# Patient Record
Sex: Female | Born: 2000 | Race: Black or African American | Hispanic: No | Marital: Single | Smoking: Never smoker
Health system: Southern US, Community
[De-identification: ages and names within clinical notes are randomized; demographics above are authoritative.]

## PROBLEM LIST (undated history)

## (undated) DIAGNOSIS — J45909 Unspecified asthma, uncomplicated: Secondary | ICD-10-CM

## (undated) DIAGNOSIS — K5900 Constipation, unspecified: Secondary | ICD-10-CM

## (undated) HISTORY — PX: NO PAST SURGERIES: SHX2092

---
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
## 2019-01-06 DIAGNOSIS — R319 Hematuria, unspecified: Secondary | ICD-10-CM | POA: Diagnosis not present

## 2019-01-06 DIAGNOSIS — N390 Urinary tract infection, site not specified: Secondary | ICD-10-CM | POA: Diagnosis not present

## 2019-01-08 DIAGNOSIS — N939 Abnormal uterine and vaginal bleeding, unspecified: Secondary | ICD-10-CM | POA: Diagnosis not present

## 2019-01-08 DIAGNOSIS — N898 Other specified noninflammatory disorders of vagina: Secondary | ICD-10-CM | POA: Diagnosis not present

## 2019-01-08 DIAGNOSIS — R1084 Generalized abdominal pain: Secondary | ICD-10-CM | POA: Diagnosis not present

## 2019-01-08 DIAGNOSIS — R112 Nausea with vomiting, unspecified: Secondary | ICD-10-CM | POA: Diagnosis not present

## 2019-01-08 DIAGNOSIS — K920 Hematemesis: Secondary | ICD-10-CM | POA: Diagnosis not present

## 2019-01-08 DIAGNOSIS — G8929 Other chronic pain: Secondary | ICD-10-CM | POA: Diagnosis not present

## 2019-01-27 DIAGNOSIS — R112 Nausea with vomiting, unspecified: Secondary | ICD-10-CM | POA: Diagnosis not present

## 2019-01-27 DIAGNOSIS — T43221A Poisoning by selective serotonin reuptake inhibitors, accidental (unintentional), initial encounter: Secondary | ICD-10-CM | POA: Diagnosis not present

## 2019-01-27 DIAGNOSIS — Z793 Long term (current) use of hormonal contraceptives: Secondary | ICD-10-CM | POA: Diagnosis not present

## 2019-01-27 DIAGNOSIS — T50901A Poisoning by unspecified drugs, medicaments and biological substances, accidental (unintentional), initial encounter: Secondary | ICD-10-CM | POA: Diagnosis not present

## 2019-01-27 DIAGNOSIS — Z79899 Other long term (current) drug therapy: Secondary | ICD-10-CM | POA: Diagnosis not present

## 2019-01-27 DIAGNOSIS — Z791 Long term (current) use of non-steroidal anti-inflammatories (NSAID): Secondary | ICD-10-CM | POA: Diagnosis not present

## 2019-01-27 DIAGNOSIS — F329 Major depressive disorder, single episode, unspecified: Secondary | ICD-10-CM | POA: Diagnosis not present

## 2019-01-27 DIAGNOSIS — N390 Urinary tract infection, site not specified: Secondary | ICD-10-CM | POA: Diagnosis not present

## 2019-01-28 DIAGNOSIS — N939 Abnormal uterine and vaginal bleeding, unspecified: Secondary | ICD-10-CM | POA: Diagnosis not present

## 2019-01-28 DIAGNOSIS — R202 Paresthesia of skin: Secondary | ICD-10-CM | POA: Diagnosis not present

## 2019-02-09 DIAGNOSIS — M25511 Pain in right shoulder: Secondary | ICD-10-CM | POA: Diagnosis not present

## 2019-02-09 DIAGNOSIS — R200 Anesthesia of skin: Secondary | ICD-10-CM | POA: Diagnosis not present

## 2019-02-10 DIAGNOSIS — Z978 Presence of other specified devices: Secondary | ICD-10-CM | POA: Diagnosis not present

## 2019-03-09 DIAGNOSIS — R1084 Generalized abdominal pain: Secondary | ICD-10-CM | POA: Diagnosis not present

## 2019-03-09 DIAGNOSIS — R1030 Lower abdominal pain, unspecified: Secondary | ICD-10-CM | POA: Diagnosis not present

## 2019-03-09 DIAGNOSIS — N920 Excessive and frequent menstruation with regular cycle: Secondary | ICD-10-CM | POA: Diagnosis not present

## 2019-03-22 DIAGNOSIS — N760 Acute vaginitis: Secondary | ICD-10-CM | POA: Diagnosis not present

## 2019-03-22 DIAGNOSIS — N898 Other specified noninflammatory disorders of vagina: Secondary | ICD-10-CM | POA: Diagnosis not present

## 2019-03-22 DIAGNOSIS — Z113 Encounter for screening for infections with a predominantly sexual mode of transmission: Secondary | ICD-10-CM | POA: Diagnosis not present

## 2019-03-22 DIAGNOSIS — N83202 Unspecified ovarian cyst, left side: Secondary | ICD-10-CM | POA: Diagnosis not present

## 2019-03-22 DIAGNOSIS — R102 Pelvic and perineal pain: Secondary | ICD-10-CM | POA: Diagnosis not present

## 2019-03-22 DIAGNOSIS — B9689 Other specified bacterial agents as the cause of diseases classified elsewhere: Secondary | ICD-10-CM | POA: Diagnosis not present

## 2019-03-22 DIAGNOSIS — Z01812 Encounter for preprocedural laboratory examination: Secondary | ICD-10-CM | POA: Diagnosis not present

## 2019-04-01 DIAGNOSIS — Z79899 Other long term (current) drug therapy: Secondary | ICD-10-CM | POA: Diagnosis not present

## 2019-04-01 DIAGNOSIS — N938 Other specified abnormal uterine and vaginal bleeding: Secondary | ICD-10-CM | POA: Diagnosis not present

## 2019-04-01 DIAGNOSIS — N920 Excessive and frequent menstruation with regular cycle: Secondary | ICD-10-CM | POA: Diagnosis not present

## 2019-04-05 IMAGING — CR DG CHEST 2V
2 series · 2 of 2 positions shown · non-contrast
Comparison: None.

CLINICAL DATA: Pain in the right scapula fever

EXAM:
CHEST - 2 VIEW

[w chest pa]
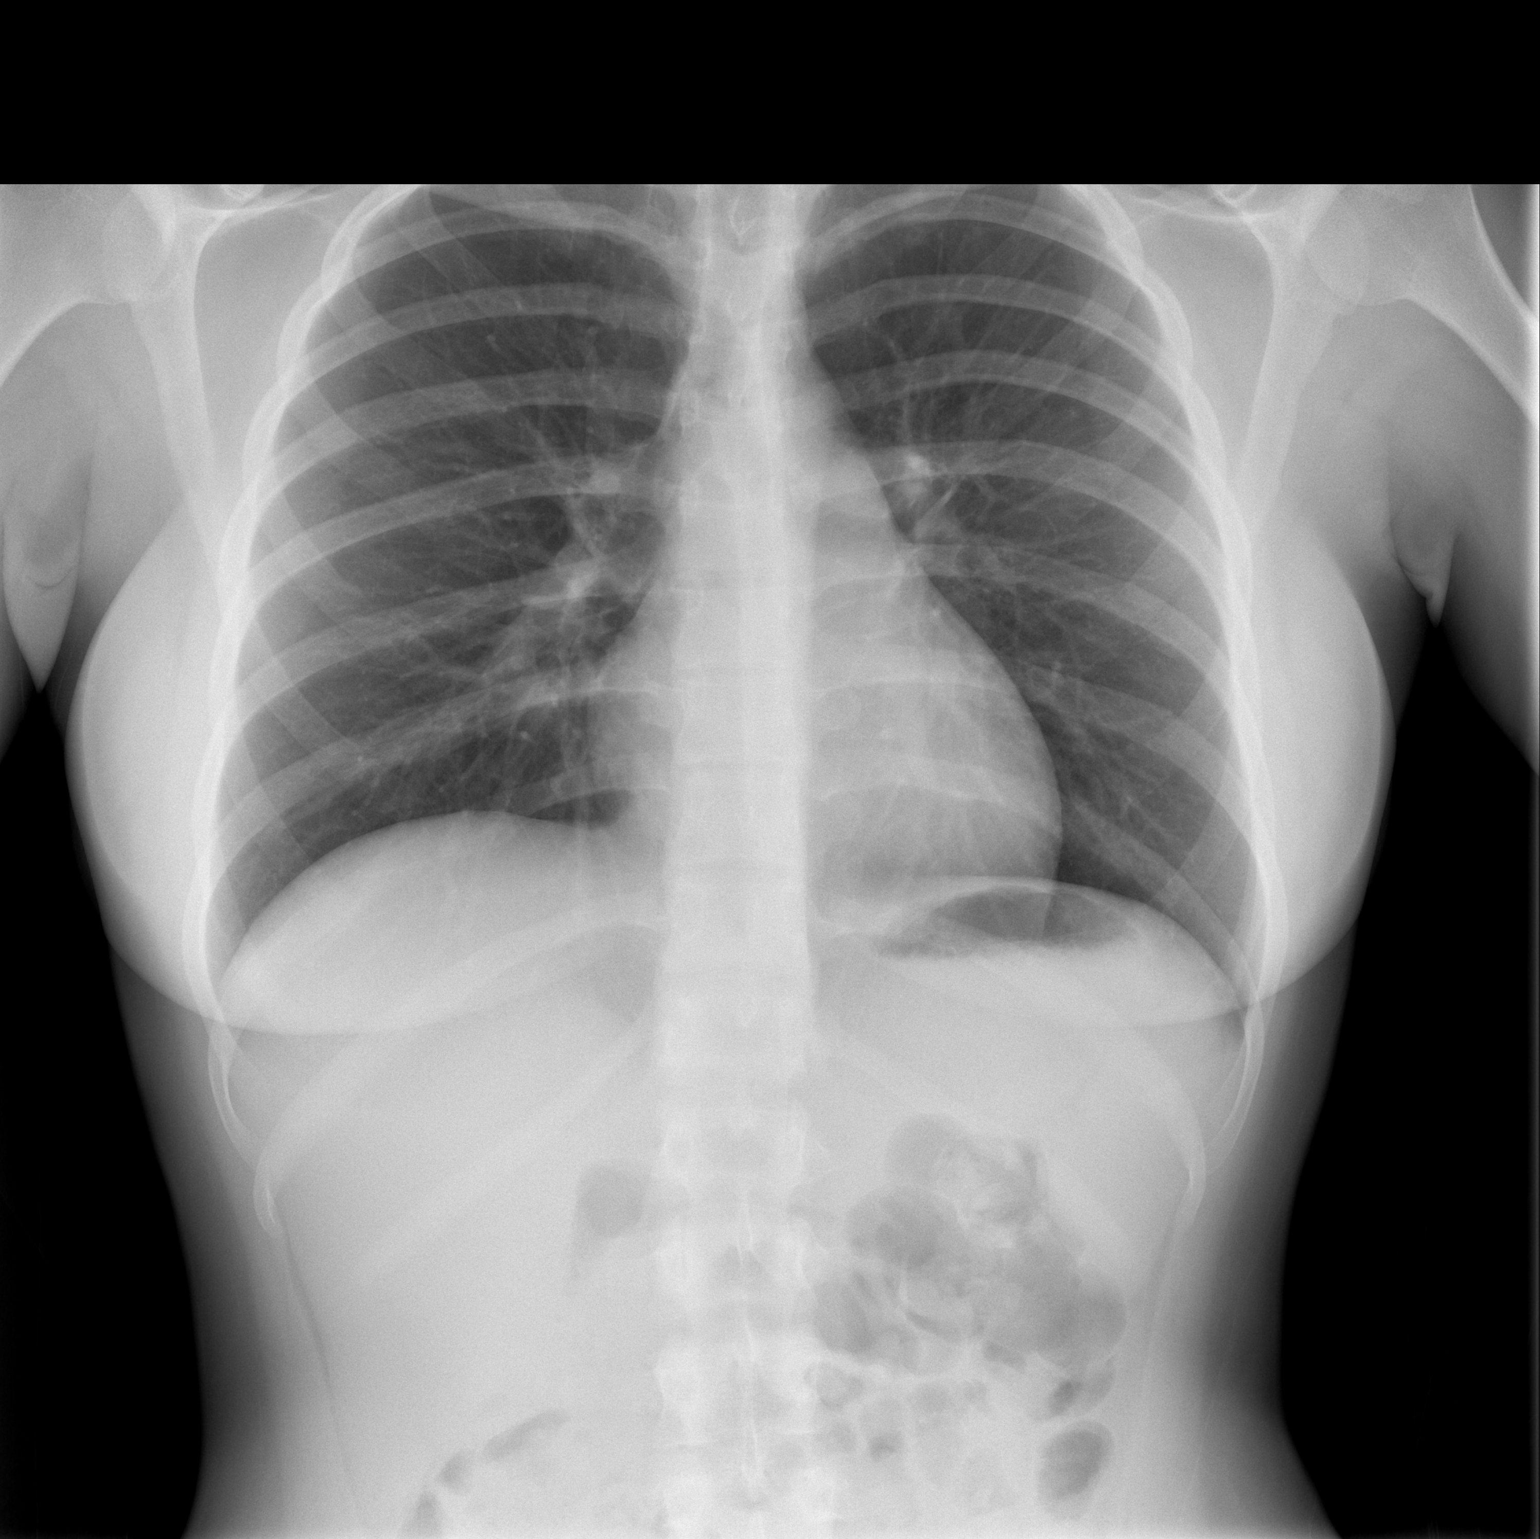

[w chest lat]
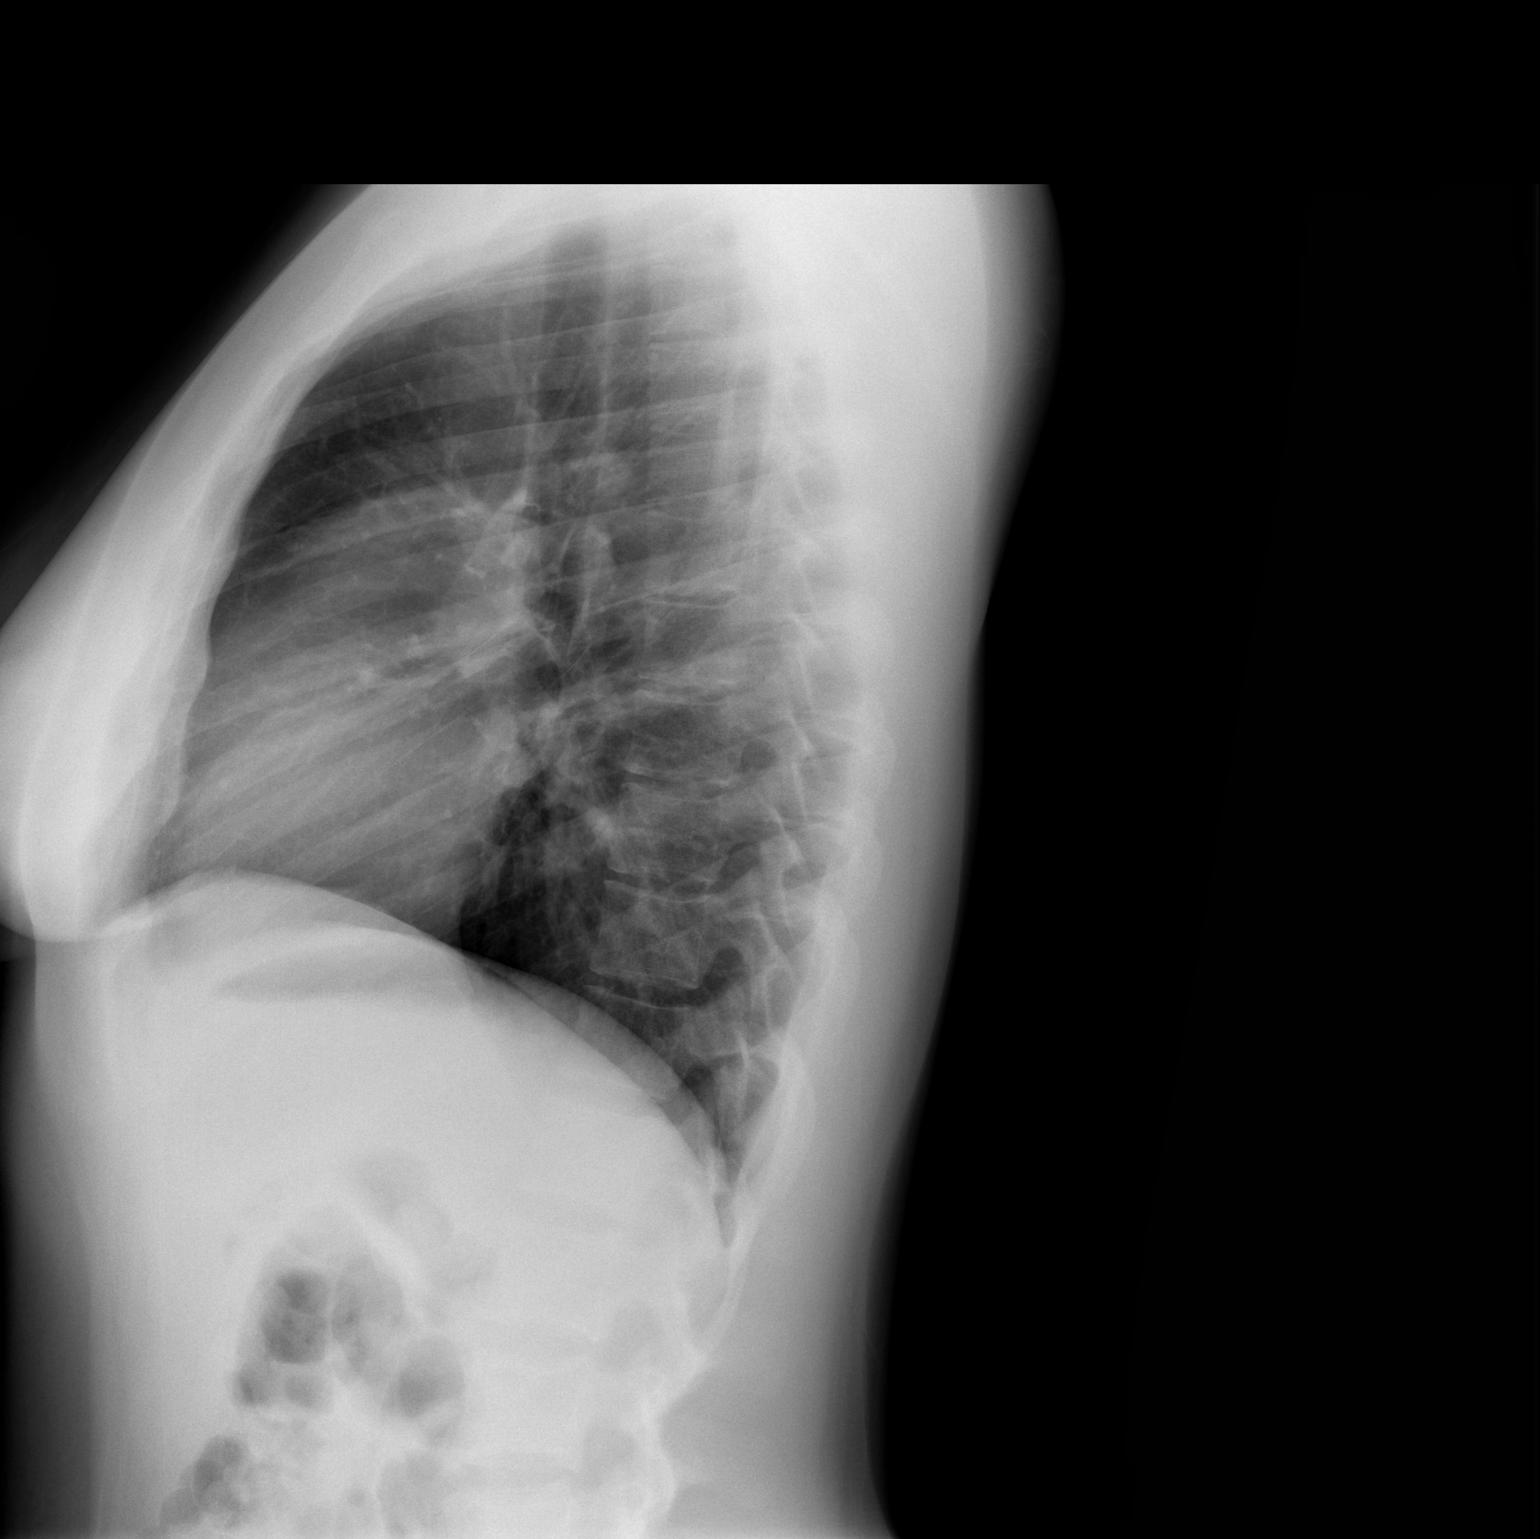

[2 of 2 positions shown; findings below may reference images not displayed]

FINDINGS: The heart size and mediastinal contours are within normal limits.
Both lungs are clear. The visualized skeletal structures are
unremarkable.
IMPRESSION: No active cardiopulmonary disease.

## 2019-04-23 DIAGNOSIS — R05 Cough: Secondary | ICD-10-CM | POA: Diagnosis not present

## 2019-04-23 DIAGNOSIS — Z793 Long term (current) use of hormonal contraceptives: Secondary | ICD-10-CM | POA: Diagnosis not present

## 2019-04-23 DIAGNOSIS — R0602 Shortness of breath: Secondary | ICD-10-CM | POA: Diagnosis not present

## 2019-04-23 DIAGNOSIS — Z791 Long term (current) use of non-steroidal anti-inflammatories (NSAID): Secondary | ICD-10-CM | POA: Diagnosis not present

## 2019-04-23 DIAGNOSIS — F329 Major depressive disorder, single episode, unspecified: Secondary | ICD-10-CM | POA: Diagnosis not present

## 2019-04-23 DIAGNOSIS — R5383 Other fatigue: Secondary | ICD-10-CM | POA: Diagnosis not present

## 2019-04-23 DIAGNOSIS — Z20828 Contact with and (suspected) exposure to other viral communicable diseases: Secondary | ICD-10-CM | POA: Diagnosis not present

## 2019-04-23 DIAGNOSIS — Z79899 Other long term (current) drug therapy: Secondary | ICD-10-CM | POA: Diagnosis not present

## 2019-04-23 DIAGNOSIS — J069 Acute upper respiratory infection, unspecified: Secondary | ICD-10-CM | POA: Diagnosis not present

## 2019-06-14 DIAGNOSIS — S161XXA Strain of muscle, fascia and tendon at neck level, initial encounter: Secondary | ICD-10-CM | POA: Diagnosis not present

## 2019-06-14 DIAGNOSIS — N3090 Cystitis, unspecified without hematuria: Secondary | ICD-10-CM | POA: Diagnosis not present

## 2019-06-14 DIAGNOSIS — S199XXA Unspecified injury of neck, initial encounter: Secondary | ICD-10-CM | POA: Diagnosis not present

## 2019-06-14 DIAGNOSIS — M62838 Other muscle spasm: Secondary | ICD-10-CM | POA: Diagnosis not present

## 2019-06-14 DIAGNOSIS — X500XXA Overexertion from strenuous movement or load, initial encounter: Secondary | ICD-10-CM | POA: Diagnosis not present

## 2019-07-13 DIAGNOSIS — R110 Nausea: Secondary | ICD-10-CM | POA: Diagnosis not present

## 2019-07-13 DIAGNOSIS — M25561 Pain in right knee: Secondary | ICD-10-CM | POA: Diagnosis not present

## 2019-07-13 DIAGNOSIS — R109 Unspecified abdominal pain: Secondary | ICD-10-CM | POA: Diagnosis not present

## 2019-07-13 DIAGNOSIS — R1012 Left upper quadrant pain: Secondary | ICD-10-CM | POA: Diagnosis not present

## 2019-07-13 DIAGNOSIS — R935 Abnormal findings on diagnostic imaging of other abdominal regions, including retroperitoneum: Secondary | ICD-10-CM | POA: Diagnosis not present

## 2019-07-28 IMAGING — US US ABDOMEN LIMITED
1 series · 14 of 25 positions shown · non-contrast
Comparison: None.

CLINICAL DATA: Right scapula pain for several weeks

EXAM:
ULTRASOUND ABDOMEN LIMITED RIGHT UPPER QUADRANT

[Series 1: us abdomen limited · 0.12mm/px · 14 of 40 slices shown]
[im 1/40]
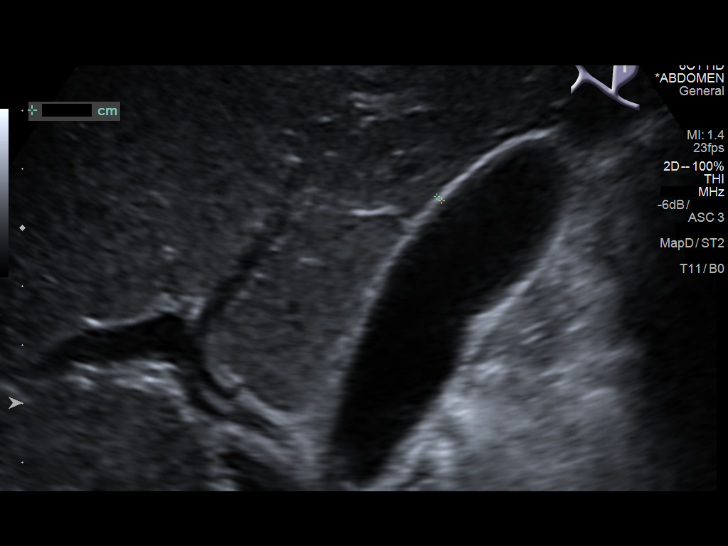
[im 4/40]
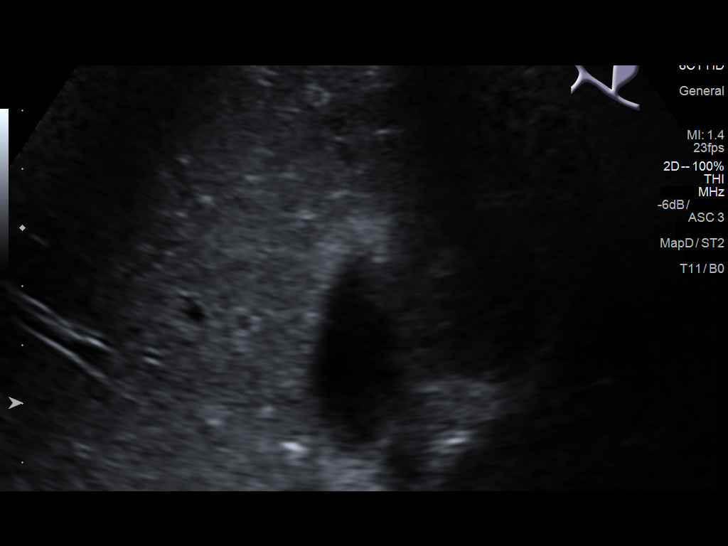
[im 7/40]
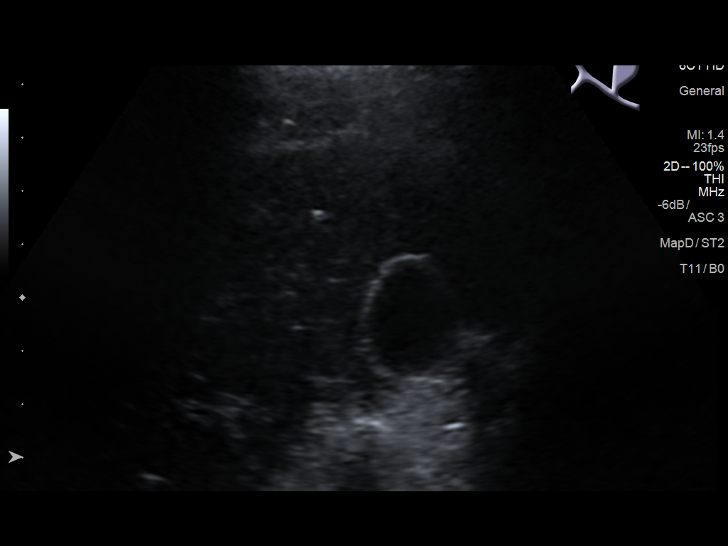
[im 10/40]
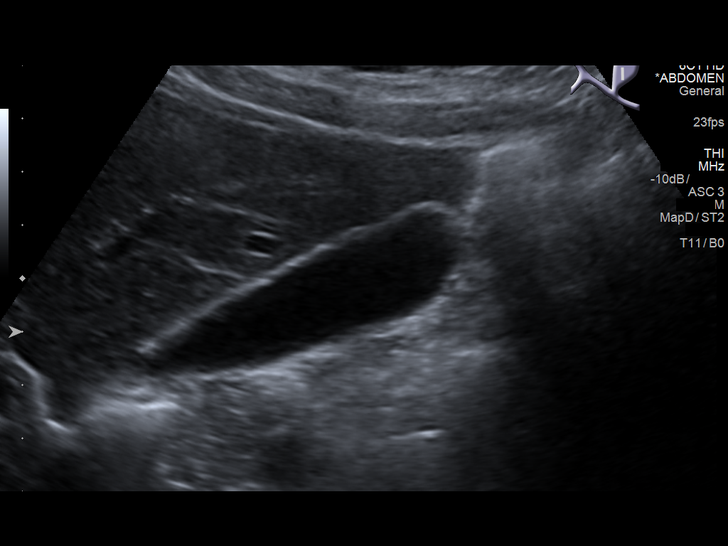
[im 14/40]
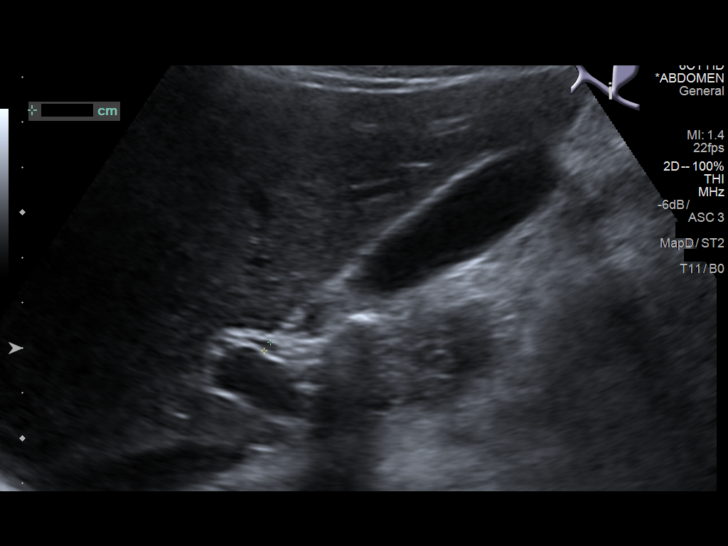
[im 15/40]
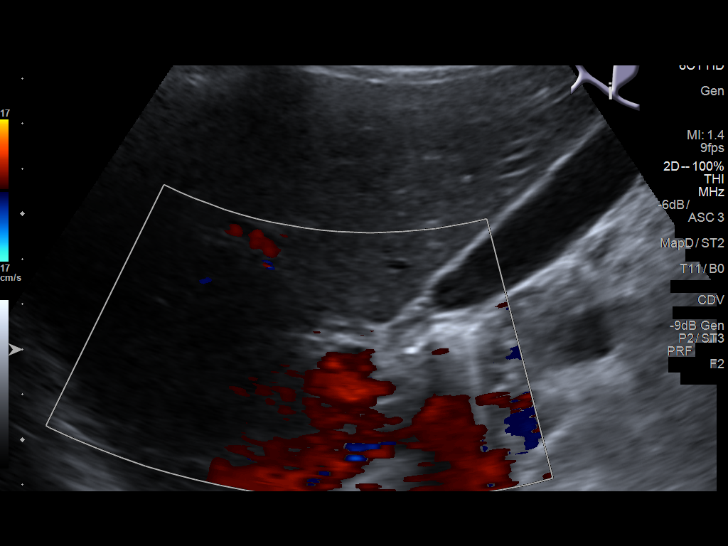
[im 18/40]
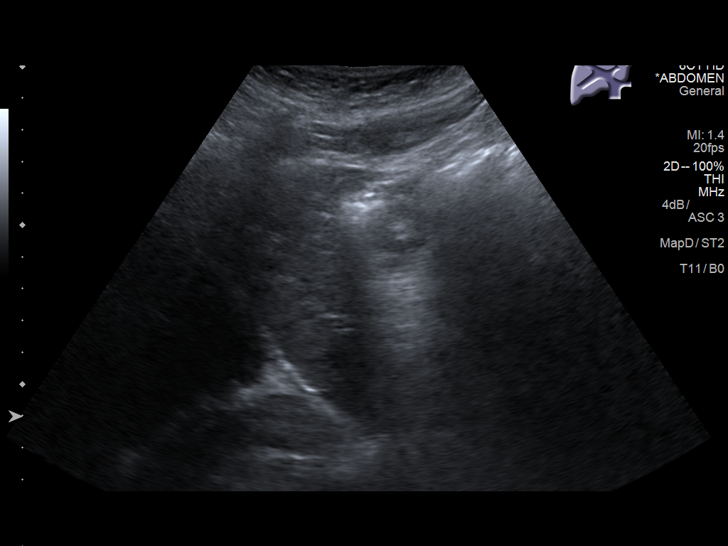
[im 22/40]
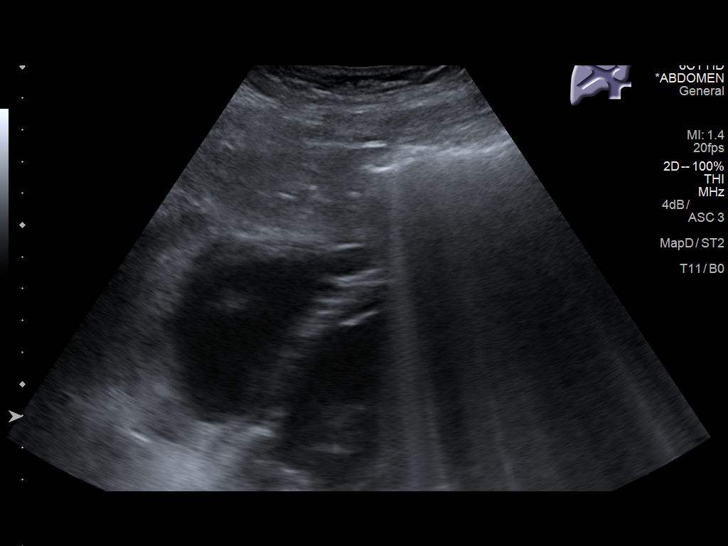
[im 25/40]
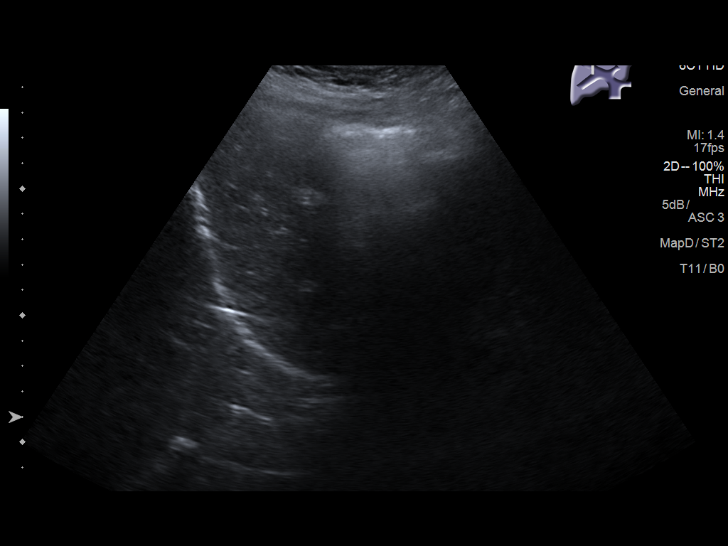
[im 27/40]
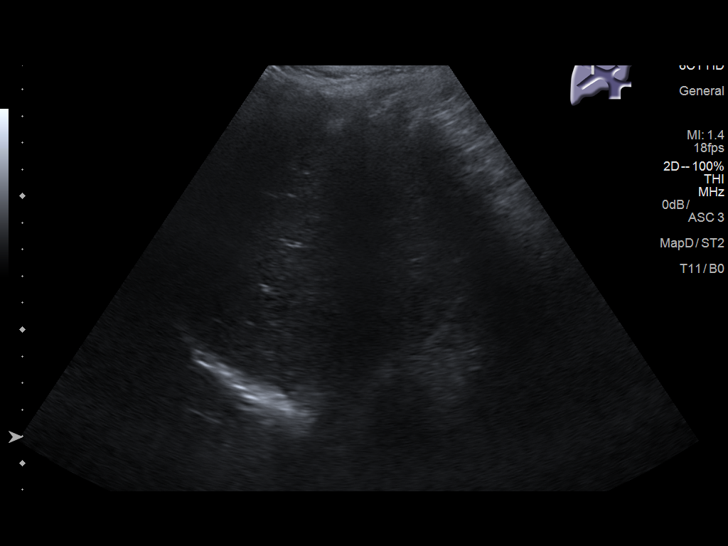
[im 30/40]
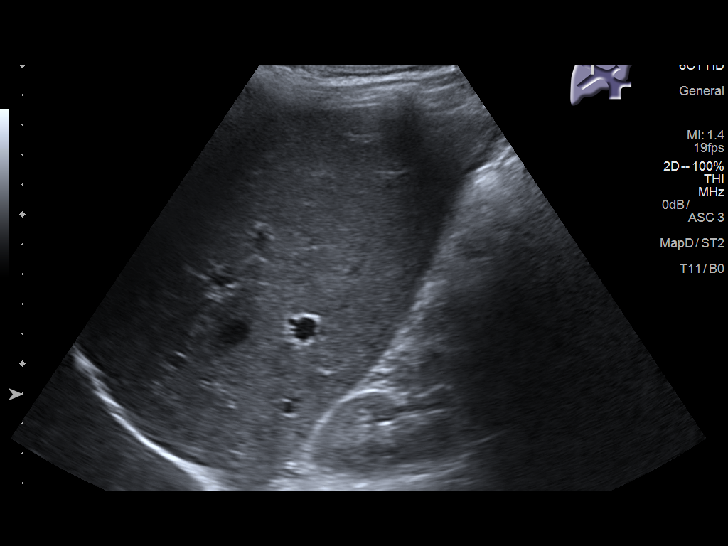
[im 33/40]
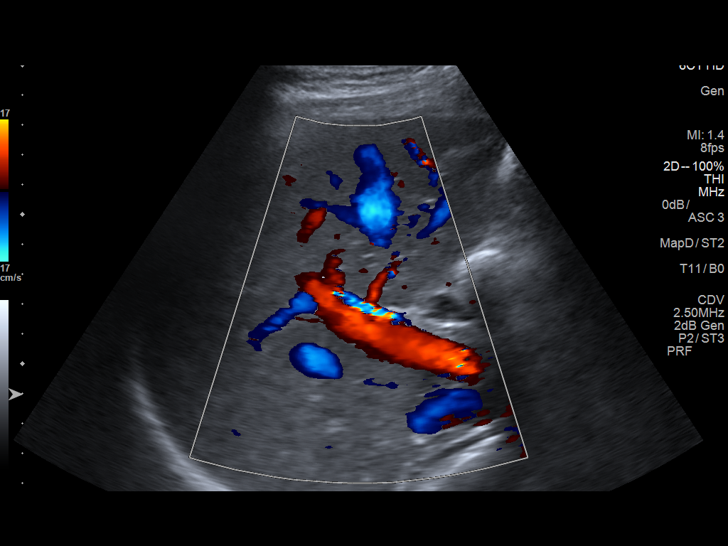
[im 36/40]
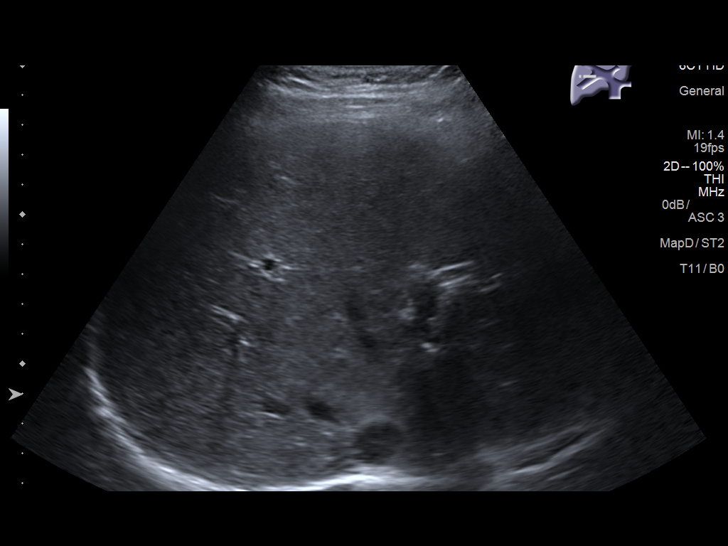
[im 40/40]
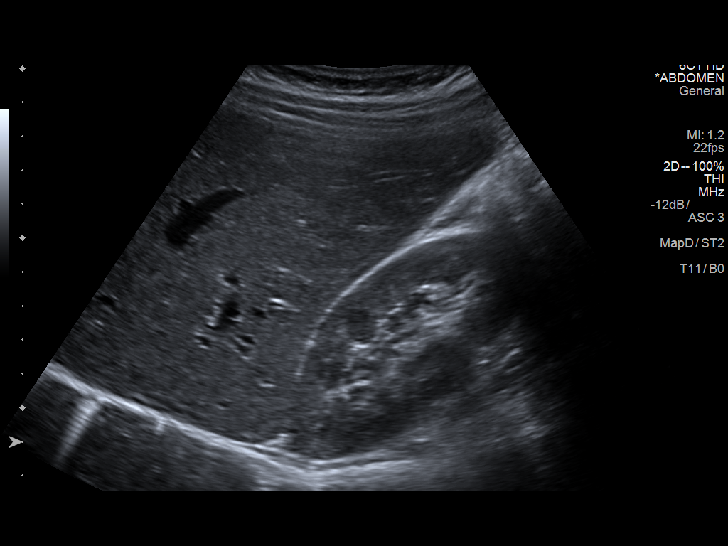

[14 of 25 positions shown; findings below may reference images not displayed]

FINDINGS: Gallbladder:

No gallstones or wall thickening visualized. No sonographic Murphy
sign noted by sonographer.

Common bile duct:

Diameter: 2.4 mm

Liver:

No focal lesion identified. Within normal limits in parenchymal
echogenicity. Portal vein is patent on color Doppler imaging with
normal direction of blood flow towards the liver.
IMPRESSION: Negative right upper quadrant abdominal ultrasound

## 2019-08-25 DIAGNOSIS — M25561 Pain in right knee: Secondary | ICD-10-CM | POA: Diagnosis not present

## 2019-09-22 ENCOUNTER — Encounter (HOSPITAL_COMMUNITY): Payer: Self-pay

## 2019-09-22 ENCOUNTER — Emergency Department (HOSPITAL_COMMUNITY): Payer: Medicaid Other

## 2019-09-22 ENCOUNTER — Other Ambulatory Visit: Payer: Self-pay

## 2019-09-22 ENCOUNTER — Emergency Department (HOSPITAL_COMMUNITY)
Admission: EM | Admit: 2019-09-22 | Discharge: 2019-09-22 | Disposition: A | Discharge status: Home / Self Care | Payer: Medicaid Other | Attending: Emergency Medicine | Admitting: Emergency Medicine

## 2019-09-22 DIAGNOSIS — W19XXXD Unspecified fall, subsequent encounter: Secondary | ICD-10-CM | POA: Insufficient documentation

## 2019-09-22 DIAGNOSIS — G8929 Other chronic pain: Secondary | ICD-10-CM | POA: Insufficient documentation

## 2019-09-22 DIAGNOSIS — M25511 Pain in right shoulder: Secondary | ICD-10-CM | POA: Insufficient documentation

## 2019-09-22 DIAGNOSIS — Z79899 Other long term (current) drug therapy: Secondary | ICD-10-CM | POA: Diagnosis not present

## 2019-09-22 MED ORDER — KETOROLAC TROMETHAMINE 15 MG/ML IJ SOLN
15.0000 mg | Freq: Once | INTRAMUSCULAR | Status: AC
  Administered 2019-09-22: 15 mg via INTRAMUSCULAR
  Filled 2019-09-22: qty 1

## 2019-09-22 MED ORDER — LIDOCAINE 5 % EX PTCH
1.0000 | MEDICATED_PATCH | CUTANEOUS | Status: DC
  Administered 2019-09-22: 17:00:00 1 via TRANSDERMAL
  Filled 2019-09-22: qty 1

## 2019-09-22 NOTE — ED Provider Notes (Signed)
[facility] DEPT Provider Note   CSN: [id number] Arrival date & time: 09/22/19  1524     History Chief Complaint  Patient presents with  . Neck Pain  . Shoulder Pain    Sandra Roach is a 19 y.o. female otherwise healthy no daily medication use presents today for right shoulder pain.  She reports that in August 2020 she fell onto her right shoulder, at that time she had mild pain which never completely resolved.  She reports lingering pain for the past 6 months.  She reports that 2-3 nights ago she was sleeping on her right shoulder, when she woke up she felt as if her pain had increased.  She denies any new injury to the area.  She describes a moderate in intensity ache constant worsened with movement improved with Tylenol occasionally radiating to the right side of her neck.  She denies any fever/chills, new injuries, headache, numbness/tingling, weakness, chest pain/shortness of breath, swelling/color change, and additional concerns. HPI     History reviewed. No pertinent past medical history.  There are no problems to display for this patient.   History reviewed. No pertinent surgical history.   OB History   No obstetric history on file.     Family History  Problem Relation Age of Onset  . Cancer Mother     Social History   Tobacco Use  . Smoking status: Never Smoker  . Smokeless tobacco: Never Used  Substance Use Topics  . Alcohol use: Never  . Drug use: Yes    Types: Marijuana    Home Medications Prior to Admission medications   Medication Sig Start Date End Date Taking? Authorizing Provider  cephALEXin (KEFLEX) 500 MG capsule Take 1 capsule (500 mg total) by mouth 3 (three) times daily. 02/10/18   Horton, Barbette Hair, MD  polyethylene glycol [facility]) packet Take 17 g by mouth daily. 02/10/18   Horton, Barbette Hair, MD    Allergies    Patient has no known allergies.  Review of Systems   Review of Systems  Constitutional:  Negative for chills and fever.  Respiratory: Negative for shortness of breath.   Cardiovascular: Negative for chest pain.  Gastrointestinal: Negative for nausea and vomiting.  Musculoskeletal: Positive for arthralgias. Negative for back pain, joint swelling and neck stiffness.  Neurological: Negative for weakness, numbness and headaches.    Physical Exam Updated Vital Signs BP 132/73 (BP Location: Right Arm)   Pulse 93   Temp 98.8 F (37.1 C) (Oral)   Resp 15   Ht 5\' 5"  (1.651 m)   Wt 79.4 kg   LMP 08/26/2019 (Approximate)   SpO2 100%   BMI 29.12 kg/m   Physical Exam Constitutional:      General: She is not in acute distress.    Appearance: Normal appearance. She is well-developed. She is not ill-appearing or diaphoretic.  HENT:     Head: Normocephalic and atraumatic.     Jaw: There is normal jaw occlusion.     Right Ear: External ear normal. No hemotympanum.     Left Ear: External ear normal. No hemotympanum.     Nose: Nose normal.     Mouth/Throat:     Mouth: Mucous membranes are moist.     Pharynx: Oropharynx is clear. Uvula midline.  Eyes:     General: Vision grossly intact. Gaze aligned appropriately.     Extraocular Movements: Extraocular movements intact.     Conjunctiva/sclera: Conjunctivae normal.     Pupils: Pupils  are equal, round, and reactive to light.  Neck:     Trachea: Trachea and phonation normal. No tracheal tenderness or tracheal deviation.      Comments: No midline C/T/L spinal tenderness to palpation, no deformity, crepitus, or step-off noted. No sign of injury to the neck or back. Cardiovascular:     Rate and Rhythm: Normal rate and regular rhythm.     Pulses:          Radial pulses are 2+ on the right side and 2+ on the left side.  Pulmonary:     Effort: Pulmonary effort is normal. No respiratory distress.     Breath sounds: Normal air entry.  Chest:     Chest wall: No tenderness.  Abdominal:     General: There is no distension.      Palpations: Abdomen is soft.     Tenderness: There is no abdominal tenderness. There is no guarding or rebound.  Musculoskeletal:        General: Normal range of motion.     Cervical back: Normal range of motion and neck supple. Muscular tenderness present. No spinous process tenderness.     Comments: Right Shoulder: Appearance normal. No obvious bony deformity. No skin swelling, erythema, heat, fluctuance or break of the skin. No clavicular deformity or TTP. TTP over right deltoid and right trapezius muscles. Active and passive flexion, extension, abduction, adduction, and internal/external rotation intact without crepitus, there is increased pain with range of motion.  Patient refuses range of motion of the arm above shoulder level.  - Right Elbow: Appearance normal. No obvious bony deformity. No skin swelling, erythema, heat, fluctuance or break of the skin. No TTP over joint. Active flexion, extension, supination and pronation full and intact without pain. Strength able and appropriate for age for flexion and extension.  Radial Pulse 2+. Cap refill <2 seconds. SILT for M/U/R distributions. Compartments soft.   Skin:    General: Skin is warm and dry.  Neurological:     Mental Status: She is alert.     GCS: GCS eye subscore is 4. GCS verbal subscore is 5. GCS motor subscore is 6.     Comments: Speech is clear and goal oriented, follows commands Major Cranial nerves without deficit, no facial droop Moves extremities without ataxia, coordination intact  Psychiatric:        Behavior: Behavior normal.     ED Results / Procedures / Treatments   Labs (all labs ordered are listed, but only abnormal results are displayed) Labs Reviewed - No data to display  EKG None  Radiology DG Shoulder Right  Result Date: 09/22/2019 CLINICAL DATA:  Fall several months ago with persistent right shoulder pain, initial encounter EXAM: RIGHT SHOULDER - 2+ VIEW COMPARISON:  None. FINDINGS: There is no  evidence of fracture or dislocation. There is no evidence of arthropathy or other focal bone abnormality. Soft tissues are unremarkable. IMPRESSION: No acute abnormality noted. Electronically Signed   By: Alcide Clever M.D.   On: 09/22/2019 16:25    Procedures Procedures (including critical care time)  Medications Ordered in ED Medications  ketorolac (TORADOL) 15 MG/ML injection 15 mg (has no administration in time range)  lidocaine (LIDODERM) 5 % 1 patch (has no administration in time range)    ED Course  I have reviewed the triage vital signs and the nursing notes.  Pertinent labs & imaging results that were available during my care of the patient were reviewed by me and considered in my  medical decision making (see chart for details).    MDM Rules/Calculators/A&P                     19 year old female presents today with a 55-month history of right shoulder pain after a fall.  Intermittent lingering pain since that time worsened over the past 2-3 days after she reports she slept on her right side.  She is neurovascular intact to the right upper extremity with strong equal distal pulses, appropriate capillary refill and intact sensation.  Strength and range of motion of the right hand wrist elbow is intact.  She has decreased strength and range of motion secondary to pain of the right shoulder and refuses to move above shoulder level.  There is no evidence of injury of the neck, no midline tenderness, step-off, crepitus or deformity she has full range of motion at the neck without pain and has no neurologic complaints.  Additionally no symptoms suggestive of cauda equina.  No evidence of a cellulitis, DVT, septic arthritis, compartment syndrome or gross ligamentous laxity.  Will obtain x-ray of the right shoulder and reassess.  Suspect musculoskeletal etiology pain today.  Of note triage note mentions right-sided neck pain.  Patient has no pain of the neck on exam, she has pain rating from the  right shoulder upper trapezius which is because of the pain.  Per Nexus criteria there is no indication for imaging of the C-spine at this time.  DG Right Shoulder:    IMPRESSION:  No acute abnormality noted.  I have personally reviewed patient's x-ray and agree with radiologist interpretation.  No obvious fractures. - Concerned today that patient may have rotator cuff injury, she will be placed in an arm sling and referred to orthopedist.  On reexamination she is well-appearing and in no acute distress.  Remains neurovascularly intact to the right upper extremity.  She is agreeable to plan of care above.  Patient was offered Toradol shot today, she accepted, she denies any history of CKD, gastric ulcer adverse reaction to NSAID medications.  Of note patient denies chance of pregnancy today, she reports that she had a negative home pregnancy test 2 days ago.  Patient states understanding that medications given or prescribed today may result in harm to of a pregnancy and she accepts these risks and still chooses not to be pregnancy tested and proceed with medications.  At this time there does not appear to be any evidence of an acute emergency medical condition and the patient appears stable for discharge with appropriate outpatient follow up. Diagnosis was discussed with patient who verbalizes understanding of care plan and is agreeable to discharge. I have discussed return precautions with patient who verbalizes understanding of return precautions. Patient encouraged to follow-up with their PCP and Ortho. All questions answered.   Note: Portions of this report may have been transcribed using voice recognition software. Every effort was made to ensure accuracy; however, inadvertent computerized transcription errors may still be present. Final Clinical Impression(s) / ED Diagnoses Final diagnoses:  Chronic right shoulder pain    Rx / DC Orders ED Discharge Orders    None       Elizabeth Palau 09/22/19 1700    Sabas Sous, MD 09/23/19 1557

## 2019-09-22 NOTE — Discharge Instructions (Addendum)
You have been diagnosed today with Right Shoulder Pain.  At this time there does not appear to be the presence of an emergent medical condition, however there is always the potential for conditions to change. Please read and follow the below instructions.  Please return to the Emergency Department immediately for any new or worsening symptoms. Please be sure to follow up with your Primary Care Provider within one week regarding your visit today; please call their office to schedule an appointment even if you are feeling better for a follow-up visit. You have been given an NSAID-containing medication called Toradol today.  Do not take the medications including ibuprofen, Aleve, Advil, naproxen or other NSAID-containing medications for the next 2 days.  Please be sure to drink plenty of water over the next few days.  You may use the sling given to you today to help protect your shoulder.  As your shoulder pain begins to improve you may use gentle range of motion exercises to avoid stiffness as long as it does not cause pain. You may call the orthopedic specialist Dr. Magnus Ivan on your discharge paperwork to schedule follow-up appointment for your shoulder pain.  As we discussed your x-ray today did not show any acute findings however unseen injuries of soft tissues as well as unseen fractures may still be present so orthopedic follow-up is recommended.  Get help right away if: Your arm, hand, or fingers: Tingle. Are numb. Are swollen. Are painful. Turn white or blue. You pee or poop on yourself (have incontinence) You have numbness or your genitals You have fever or chills You have any new/concerning or worsening of symptoms  Please read the additional information packets attached to your discharge summary.  Do not take your medicine if  develop an itchy rash, swelling in your mouth or lips, or difficulty breathing; call 911 and seek immediate emergency medical attention if this occurs.  Note:  Portions of this text may have been transcribed using voice recognition software. Every effort was made to ensure accuracy; however, inadvertent computerized transcription errors may still be present.

## 2019-09-22 NOTE — ED Notes (Signed)
An After Visit Summary was printed and given to the patient. Discharge instructions given and no further questions at this time. Pt denied sling immobilizer. Pt moving both arms with complaint of pain.

## 2019-09-22 NOTE — ED Triage Notes (Signed)
Patient c/o right neck pain that radiates into the right shoulder x 2 weeks.

## 2019-09-27 ENCOUNTER — Ambulatory Visit: Payer: Medicaid Other | Admitting: Orthopaedic Surgery

## 2019-10-13 DIAGNOSIS — Z20828 Contact with and (suspected) exposure to other viral communicable diseases: Secondary | ICD-10-CM | POA: Diagnosis not present

## 2019-11-23 DIAGNOSIS — R110 Nausea: Secondary | ICD-10-CM | POA: Diagnosis not present

## 2019-11-23 DIAGNOSIS — R197 Diarrhea, unspecified: Secondary | ICD-10-CM | POA: Diagnosis not present

## 2019-11-23 DIAGNOSIS — R109 Unspecified abdominal pain: Secondary | ICD-10-CM | POA: Diagnosis not present

## 2019-11-23 DIAGNOSIS — R1084 Generalized abdominal pain: Secondary | ICD-10-CM | POA: Diagnosis not present

## 2019-11-24 ENCOUNTER — Ambulatory Visit
Admission: EM | Admit: 2019-11-24 | Discharge: 2019-11-24 | Disposition: A | Discharge status: Home / Self Care | Payer: Medicaid Other

## 2019-11-24 ENCOUNTER — Other Ambulatory Visit: Payer: Self-pay

## 2019-11-24 ENCOUNTER — Encounter: Payer: Self-pay | Admitting: Emergency Medicine

## 2019-11-24 DIAGNOSIS — R1084 Generalized abdominal pain: Secondary | ICD-10-CM

## 2019-11-24 DIAGNOSIS — K5900 Constipation, unspecified: Secondary | ICD-10-CM | POA: Diagnosis not present

## 2019-11-24 DIAGNOSIS — K591 Functional diarrhea: Secondary | ICD-10-CM

## 2019-11-24 HISTORY — DX: Constipation, unspecified: K59.00

## 2019-11-24 MED ORDER — POLYETHYLENE GLYCOL 3350 17 GM/SCOOP PO POWD: 1.0000 | Freq: Once | ORAL | 0 refills | Status: AC

## 2019-11-24 NOTE — ED Triage Notes (Signed)
Patient has had rectal pain for a month.  Patient has had hard stools recently.  Noticed blood on tissue and in toilet patient complains of lower back and lower abdominal pain-a cramping feeling.  Denies urinary or vaginal issues

## 2019-11-24 NOTE — Discharge Instructions (Addendum)
Take 2 doses of Miralax for the next 2 days. Then take one dose daily for the rest of the week.   If you experience increased pain or bleeding that does not resolve after using the bathroom, go to the ER for further evaluation and treatment.

## 2019-11-24 NOTE — ED Provider Notes (Signed)
[place]   [id number] 11/24/19 Arrival Time: 1497  CC: ABDOMINAL PAIN  SUBJECTIVE:  Sandra Roach is a 19 y.o. female who presents with complaint of abdominal discomfort that began gradually 1 week  ago.  Denies a precipitating event, trauma, close contacts with similar symptoms, recent travel or antibiotic use. Reports generalized abdominal pain and the urge to move her bowels.  Describes as intermittent/stable and dull/achy in character.  Has not tried OTC medications. Denies alleviating or aggravating factors. Denies similar symptoms in the past.  Last BM today. Reports watery diarrhea that is blood streaked. Reports that she has had hemorrhoids in the past.   Denies fever, chills, appetite changes, weight changes, nausea, vomiting, chest pain, SOB, melena, dysuria, difficulty urinating, increased frequency or urgency, flank pain, loss of bowel or bladder function, vaginal discharge, vaginal odor, vaginal bleeding, dyspareunia, pelvic pain.     Patient's last menstrual period was 11/14/2019.  ROS: As per HPI.  All other pertinent ROS negative.     Past Medical History:  Diagnosis Date  . Constipation    History reviewed. No pertinent surgical history. No Known Allergies No current facility-administered medications on file prior to encounter.   Current Outpatient Medications on File Prior to Encounter  Medication Sig Dispense Refill  . acetaminophen (TYLENOL) 325 MG tablet Take 650 mg by mouth every 6 (six) hours as needed.    . cephALEXin (KEFLEX) 500 MG capsule Take 1 capsule (500 mg total) by mouth 3 (three) times daily. 21 capsule 0   Social History   Socioeconomic History  . Marital status: Single    Spouse name: Not on file  . Number of children: Not on file  . Years of education: Not on file  . Highest education level: Not on file  Occupational History  . Not on file  Tobacco Use  . Smoking status: Never Smoker  . Smokeless tobacco: Never Used    Substance and Sexual Activity  . Alcohol use: Yes  . Drug use: Yes    Types: Marijuana  . Sexual activity: Not on file  Other Topics Concern  . Not on file  Social History Narrative  . Not on file   Social Determinants of Health   Financial Resource Strain:   . Difficulty of Paying Living Expenses:   Food Insecurity:   . Worried About Charity fundraiser in the Last Year:   . Arboriculturist in the Last Year:   Transportation Needs:   . Film/video editor (Medical):   Marland Kitchen Lack of Transportation (Non-Medical):   Physical Activity:   . Days of Exercise per Week:   . Minutes of Exercise per Session:   Stress:   . Feeling of Stress :   Social Connections:   . Frequency of Communication with Friends and Family:   . Frequency of Social Gatherings with Friends and Family:   . Attends Religious Services:   . Active Member of Clubs or Organizations:   . Attends Archivist Meetings:   Marland Kitchen Marital Status:   Intimate Partner Violence:   . Fear of Current or Ex-Partner:   . Emotionally Abused:   Marland Kitchen Physically Abused:   . Sexually Abused:    Family History  Problem Relation Age of Onset  . Cancer Mother      OBJECTIVE:  Vitals:   11/24/19 0934  BP: 111/75  Pulse: 74  Resp: 18  Temp: 98.2 F (36.8 C)  TempSrc: Oral  SpO2: 98%  General appearance: Alert; NAD HEENT: NCAT.  Oropharynx clear.  Lungs: clear to auscultation bilaterally without adventitious breath sounds Heart: regular rate and rhythm.  Radial pulses 2+ symmetrical bilaterally Abdomen: soft, non-distended; normal active bowel sounds; non-tender to light and deep palpation; nontender at McBurney's point; negative Murphy's sign; negative rebound; no guarding Back: no CVA tenderness Extremities: no edema; symmetrical with no gross deformities Skin: warm and dry Neurologic: normal gait Psychological: alert and cooperative; normal mood and affect  LABS: No results found for this or any previous  visit (from the past 24 hour(s)).  DIAGNOSTIC STUDIES: No results found.   ASSESSMENT & PLAN:  1. Constipation, unspecified constipation type   2. Generalized abdominal pain   3. Functional diarrhea     Meds ordered this encounter  Medications  . polyethylene glycol powder (GLYCOLAX/MIRALAX) 17 GM/SCOOP powder    Sig: Take 255 g by mouth once for 1 dose.    Dispense:  255 g    Refill:  0    Order Specific Question:   Supervising Provider    Answer:   Merrilee Jansky [id number]     Get rest and drink fluids Miralax regimen prescribed. Take as directed.   High suspicion for constipation given clinical presentation, no acute distress If you experience new or worsening symptoms return or go to ER such as fever, chills, nausea, vomiting, diarrhea, bloody or dark tarry stools, constipation, urinary symptoms, worsening abdominal discomfort, symptoms that do not improve with medications, inability to keep fluids down.  Reviewed expectations re: course of current medical issues. Questions answered. Outlined signs and symptoms indicating need for more acute intervention. Patient verbalized understanding. After Visit Summary given.    Moshe Cipro, NP 11/24/19 1043

## 2020-01-29 ENCOUNTER — Other Ambulatory Visit: Payer: Self-pay

## 2020-01-29 ENCOUNTER — Emergency Department (HOSPITAL_COMMUNITY)
Admission: EM | Admit: 2020-01-29 | Discharge: 2020-01-30 | Disposition: A | Discharge status: Home / Self Care | Payer: Medicaid Other | Attending: Emergency Medicine | Admitting: Emergency Medicine

## 2020-01-29 ENCOUNTER — Encounter (HOSPITAL_COMMUNITY): Payer: Self-pay | Admitting: Emergency Medicine

## 2020-01-29 DIAGNOSIS — Z5321 Procedure and treatment not carried out due to patient leaving prior to being seen by health care provider: Secondary | ICD-10-CM | POA: Diagnosis not present

## 2020-01-29 DIAGNOSIS — R55 Syncope and collapse: Secondary | ICD-10-CM | POA: Diagnosis not present

## 2020-01-29 LAB — COMPREHENSIVE METABOLIC PANEL
ALT: 18 U/L (ref 0–44)
AST: 21 U/L (ref 15–41)
Albumin: 4 g/dL (ref 3.5–5.0)
Alkaline Phosphatase: 53 U/L (ref 38–126)
Anion gap: 9 (ref 5–15)
BUN: 8 mg/dL (ref 6–20)
CO2: 27 mmol/L (ref 22–32)
Calcium: 9.1 mg/dL (ref 8.9–10.3)
Chloride: 104 mmol/L (ref 98–111)
Creatinine, Ser: 0.78 mg/dL (ref 0.44–1.00)
GFR calc Af Amer: >60 mL/min (ref >60)
GFR calc non Af Amer: >60 mL/min (ref >60)
Glucose, Bld: 91 mg/dL (ref 70–99)
Potassium: 3.7 mmol/L (ref 3.5–5.1)
Sodium: 140 mmol/L (ref 135–145)
Total Bilirubin: 0.7 mg/dL (ref 0.3–1.2)
Total Protein: 7 g/dL (ref 6.5–8.1)

## 2020-01-29 LAB — LIPASE, BLOOD: Lipase: 26 U/L (ref 11–51)

## 2020-01-29 LAB — CBC
HCT: 42.1 % (ref 36.0–46.0)
Hemoglobin: 13.7 g/dL (ref 12.0–15.0)
MCH: 30.4 pg (ref 26.0–34.0)
MCHC: 32.5 g/dL (ref 30.0–36.0)
MCV: 93.3 fL (ref 80.0–100.0)
Platelets: 240 10*3/uL (ref 150–400)
RBC: 4.51 MIL/uL (ref 3.87–5.11)
RDW: 12.7 % (ref 11.5–15.5)
WBC: 5.7 10*3/uL (ref 4.0–10.5)
nRBC: 0 % (ref 0.0–0.2)

## 2020-01-29 LAB — I-STAT BETA HCG BLOOD, ED (MC, WL, AP ONLY): I-stat hCG, quantitative: <5 m[IU]/mL (ref <5)

## 2020-01-29 MED ORDER — SODIUM CHLORIDE 0.9% FLUSH
3.0000 mL | Freq: Once | INTRAVENOUS | Status: DC
Start: 2020-01-29 — End: 2020-01-30

## 2020-01-29 NOTE — ED Triage Notes (Signed)
Pt states on Saturday morning at 1am she was standing up hugging her boyfriend and her stomach was "stuck in the growling process".  States she had a syncopal episode and fell hitting L side of head/face on concrete and L shoulder on concrete.  States she had an 8-10 min LOC.  Since then she has L sided headache, L shoulder pain, lower abd pain, and vaginal bleeding.  States she is unsure if she is pregnant.  LMP beginning of May.

## 2020-01-31 ENCOUNTER — Telehealth: Payer: Self-pay | Admitting: *Deleted

## 2020-01-31 NOTE — Telephone Encounter (Signed)
Attempted to contact pt to complete transition of care assessment; left message on voicemail.  Katrice Huxley Vanwagoner, RN, BSN, CCRN [facility] [phone]  

## 2020-02-03 ENCOUNTER — Telehealth: Payer: Self-pay | Admitting: *Deleted

## 2020-02-03 NOTE — Telephone Encounter (Signed)
Attempted to contact pt to complete transition of care assessment; left message on voicemail.  Katrice Mamoru Takeshita, RN, BSN, CCRN [facility] [phone]  

## 2020-08-15 DIAGNOSIS — Z20822 Contact with and (suspected) exposure to covid-19: Secondary | ICD-10-CM | POA: Diagnosis not present

## 2020-08-17 ENCOUNTER — Encounter (HOSPITAL_COMMUNITY): Payer: Self-pay

## 2020-08-17 ENCOUNTER — Emergency Department (HOSPITAL_COMMUNITY)
Admission: EM | Admit: 2020-08-17 | Discharge: 2020-08-17 | Disposition: A | Discharge status: Home / Self Care | Payer: Medicaid Other | Attending: Emergency Medicine | Admitting: Emergency Medicine

## 2020-08-17 ENCOUNTER — Other Ambulatory Visit: Payer: Self-pay

## 2020-08-17 DIAGNOSIS — H5713 Ocular pain, bilateral: Secondary | ICD-10-CM | POA: Diagnosis not present

## 2020-08-17 DIAGNOSIS — Z5321 Procedure and treatment not carried out due to patient leaving prior to being seen by health care provider: Secondary | ICD-10-CM | POA: Insufficient documentation

## 2020-08-17 NOTE — ED Triage Notes (Signed)
Pt reports bilateral eye burning and pain since using colored contacts yesterday, sclera is red. Pt also requesting a pregnancy test

## 2020-08-17 NOTE — ED Notes (Signed)
Pt called X 4. No answer. Pt will be moved OTF.

## 2021-01-21 ENCOUNTER — Emergency Department (HOSPITAL_COMMUNITY)
Admission: EM | Admit: 2021-01-21 | Discharge: 2021-01-21 | Disposition: A | Discharge status: Home / Self Care | Payer: Medicaid Other | Attending: Emergency Medicine | Admitting: Emergency Medicine

## 2021-01-21 ENCOUNTER — Encounter (HOSPITAL_COMMUNITY): Payer: Self-pay | Admitting: *Deleted

## 2021-01-21 DIAGNOSIS — N939 Abnormal uterine and vaginal bleeding, unspecified: Secondary | ICD-10-CM | POA: Diagnosis not present

## 2021-01-21 DIAGNOSIS — R102 Pelvic and perineal pain: Secondary | ICD-10-CM | POA: Insufficient documentation

## 2021-01-21 LAB — URINALYSIS, ROUTINE W REFLEX MICROSCOPIC
Bacteria, UA: NONE SEEN
Bilirubin Urine: NEGATIVE
Glucose, UA: NEGATIVE mg/dL
Ketones, ur: NEGATIVE mg/dL
Leukocytes,Ua: NEGATIVE
Nitrite: NEGATIVE
Protein, ur: NEGATIVE mg/dL
Specific Gravity, Urine: 1.029 (ref 1.005–1.030)
pH: 5 (ref 5.0–8.0)

## 2021-01-21 LAB — CBC WITH DIFFERENTIAL/PLATELET
Abs Immature Granulocytes: 0.01 10*3/uL (ref 0.00–0.07)
Basophils Absolute: 0 10*3/uL (ref 0.0–0.1)
Basophils Relative: 1 %
Eosinophils Absolute: 0.1 10*3/uL (ref 0.0–0.5)
Eosinophils Relative: 2 %
HCT: 41.1 % (ref 36.0–46.0)
Hemoglobin: 13.5 g/dL (ref 12.0–15.0)
Immature Granulocytes: 0 %
Lymphocytes Relative: 38 %
Lymphs Abs: 2.1 10*3/uL (ref 0.7–4.0)
MCH: 31 pg (ref 26.0–34.0)
MCHC: 32.8 g/dL (ref 30.0–36.0)
MCV: 94.3 fL (ref 80.0–100.0)
Monocytes Absolute: 0.5 10*3/uL (ref 0.1–1.0)
Monocytes Relative: 8 %
Neutro Abs: 2.9 10*3/uL (ref 1.7–7.7)
Neutrophils Relative %: 51 %
Platelets: 224 10*3/uL (ref 150–400)
RBC: 4.36 MIL/uL (ref 3.87–5.11)
RDW: 13.2 % (ref 11.5–15.5)
WBC: 5.6 10*3/uL (ref 4.0–10.5)
nRBC: 0 % (ref 0.0–0.2)

## 2021-01-21 LAB — COMPREHENSIVE METABOLIC PANEL
ALT: 20 U/L (ref 0–44)
AST: 29 U/L (ref 15–41)
Albumin: 3.7 g/dL (ref 3.5–5.0)
Alkaline Phosphatase: 56 U/L (ref 38–126)
Anion gap: 9 (ref 5–15)
BUN: 10 mg/dL (ref 6–20)
CO2: 23 mmol/L (ref 22–32)
Calcium: 9 mg/dL (ref 8.9–10.3)
Chloride: 106 mmol/L (ref 98–111)
Creatinine, Ser: 0.86 mg/dL (ref 0.44–1.00)
GFR, Estimated: >60 mL/min (ref >60)
Glucose, Bld: 90 mg/dL (ref 70–99)
Potassium: 3.4 mmol/L — ABNORMAL LOW (ref 3.5–5.1)
Sodium: 138 mmol/L (ref 135–145)
Total Bilirubin: 1 mg/dL (ref 0.3–1.2)
Total Protein: 6.7 g/dL (ref 6.5–8.1)

## 2021-01-21 LAB — WET PREP, GENITAL
Clue Cells Wet Prep HPF POC: NONE SEEN
Sperm: NONE SEEN
Trich, Wet Prep: NONE SEEN
Yeast Wet Prep HPF POC: NONE SEEN

## 2021-01-21 LAB — I-STAT BETA HCG BLOOD, ED (MC, WL, AP ONLY): I-stat hCG, quantitative: <5 m[IU]/mL (ref <5)

## 2021-01-21 NOTE — ED Triage Notes (Signed)
Pt reports recent + pregnancy test, lmp 5/22. Woke up this am with lower abd pain and light vaginal bleeding.

## 2021-01-21 NOTE — ED Provider Notes (Signed)
[facility] EMERGENCY DEPARTMENT Provider Note   CSN: [id number] Arrival date & time: 01/21/21  1127     History Chief Complaint  Patient presents with   Abdominal Pain   Vaginal Bleeding    Sandra Roach is a 20 y.o. female.   Abdominal Pain Associated symptoms: vaginal bleeding   Vaginal Bleeding Associated symptoms: abdominal pain    20yoF no relevant pmhx, p/w vaginal bleeding. Onset of vb was ~0900 when pt woke up, initially spotting but now increasing severity. Assc sx include pelvic discomfort, mild, nonradiating, no modifying factors. Additionally has had x1.55mo of nbnb nv nearly daily (no clear triggers), constipation, mild sob, back pain, intermittent ha, mild pedal edema. Sexually active, has not been using protection regularly. Not on any contraceptive. LMP late may. Has taken few pregnancy test, faintly + late may, strongly + mid-June, negative late June.  No further medical concern a this time, including fevers, chills, diaphoresis, sob, cp, palpitations, abdominal pain, dysuria, hematuria, seizure, syncope, new rash.  Hx obtained from pt and chart review.    Past Medical History:  Diagnosis Date   Constipation     There are no problems to display for this patient.   History reviewed. No pertinent surgical history.   OB History     Gravida  1   Para      Term      Preterm      AB      Living         SAB      IAB      Ectopic      Multiple      Live Births              Family History  Problem Relation Age of Onset   Cancer Mother     Social History   Tobacco Use   Smoking status: Never   Smokeless tobacco: Never  Vaping Use   Vaping Use: Never used  Substance Use Topics   Alcohol use: Yes   Drug use: Yes    Types: Marijuana    Home Medications Prior to Admission medications   Medication Sig Start Date End Date Taking? Authorizing Provider  acetaminophen (TYLENOL) 325 MG tablet Take 650 mg by  mouth every 6 (six) hours as needed.    [provider]  cephALEXin (KEFLEX) 500 MG capsule Take 1 capsule (500 mg total) by mouth 3 (three) times daily. 02/10/18   Horton, Mayer Masker, MD    Allergies    Patient has no known allergies.  Review of Systems   Review of Systems  Gastrointestinal:  Positive for abdominal pain.  Genitourinary:  Positive for vaginal bleeding.  All other systems reviewed and are negative.  Physical Exam Updated Vital Signs BP 128/71   Pulse 80   Temp 98.1 F (36.7 C) (Oral)   Resp 18   LMP 12/02/2020 (Approximate)   SpO2 100%   Physical Exam Vitals and nursing note reviewed. Exam conducted with a chaperone present.  Constitutional:      General: She is not in acute distress.    Appearance: She is well-developed. She is not toxic-appearing.  HENT:     Head: Normocephalic and atraumatic.     Mouth/Throat:     Mouth: Mucous membranes are moist.     Pharynx: Oropharynx is clear.  Eyes:     Extraocular Movements: Extraocular movements intact.     Pupils: Pupils are equal, round, and reactive to  light.  Cardiovascular:     Rate and Rhythm: Normal rate and regular rhythm.     Heart sounds: No murmur heard.   No friction rub. No gallop.  Pulmonary:     Effort: Pulmonary effort is normal.     Breath sounds: No stridor. No wheezing, rhonchi or rales.  Abdominal:     General: Abdomen is flat. There is no distension.     Palpations: Abdomen is soft.     Tenderness: There is no abdominal tenderness. There is no right CVA tenderness, left CVA tenderness, guarding or rebound.  Genitourinary:    Vagina: No signs of injury and foreign body. Bleeding present. No vaginal discharge or tenderness.     Cervix: No cervical motion tenderness, discharge or friability.     Uterus: Normal.      Adnexa: Right adnexa normal and left adnexa normal.  Skin:    General: Skin is warm and dry.     Capillary Refill: Capillary refill takes less than 2 seconds.   Neurological:     General: No focal deficit present.     Mental Status: She is alert and oriented to person, place, and time.  Psychiatric:        Mood and Affect: Mood normal.        Behavior: Behavior normal.    ED Results / Procedures / Treatments   Labs (all labs ordered are listed, but only abnormal results are displayed) Labs Reviewed  WET PREP, GENITAL - Abnormal; Notable for the following components:      Result Value   WBC, Wet Prep HPF POC FEW (*)    All other components within normal limits  COMPREHENSIVE METABOLIC PANEL - Abnormal; Notable for the following components:   Potassium 3.4 (*)    All other components within normal limits  URINALYSIS, ROUTINE W REFLEX MICROSCOPIC - Abnormal; Notable for the following components:   APPearance TURBID (*)    Hgb urine dipstick MODERATE (*)    All other components within normal limits  CBC WITH DIFFERENTIAL/PLATELET  I-STAT BETA HCG BLOOD, ED ([facility], [facility], [facility] ONLY)  GC/CHLAMYDIA PROBE AMP ([facility]) NOT AT [facility]    EKG None  Radiology No results found.  Procedures Procedures   Medications Ordered in ED Medications - No data to display  ED Course  I have reviewed the triage vital signs and the nursing notes.  Pertinent labs & imaging results that were available during my care of the patient were reviewed by me and considered in my medical decision making (see chart for details).    MDM Rules/Calculators/A&P                          This is a 20yoF no relevant pmhx, presenting for vaginal bleeding and pelvic discomfort. LMP late may, had + upt x2 then and mid-June, but neg upt late June. Had assc sx suggestive of pregnancy that have mostly resolved. On exam, blood in vaginal vault with some clotting but no apparent products of conception. Os is closed w/o purulent discharge, though gcc and wet prep collected to be sure. Bimanual exam unremarkable.  Ddx included: menstrual bleed, fibroids, endometriosis, spontaneous  abortion, pregnancy, ectopic pregnancy, ruptured cyst, perforation, trauma  All studies independently reviewed by myself, d/w the attending, factored into my mdm. -upt: undetectable -unremarkable: cbcd, cmp, ua -pending: gcc, wet prep  Presentation appears most c/w resumption of menstrual period, likely after a spontaneous abortion. No products of  conception noted on exam to suggest ongoing abortion. Patient states that pain is not significant, suggesting against fibroids, endometriosis. UPT negative. Benign serial abdominal exams. No evidence of trauma on GU exam.  Therefore, feel pt stable for dc home w/ close outpt f/u. She states that she is interested in switching ob/gyn providers, so contact info provided for [facility] center of [place]. Return precautions discussed. Pt understands and agrees w/ plan.  Pt hds, nontoxic, ambulatory on reevaluation, subsequently discharged.  Final Clinical Impression(s) / ED Diagnoses Final diagnoses:  Vaginal bleeding    Rx / DC Orders ED Discharge Orders     None        Colvin Caroli, MD 01/22/21 1610    Linwood Dibbles, MD 01/22/21 1005

## 2021-01-21 NOTE — Discharge Instructions (Addendum)
You were evaluated for your vaginal bleeding.  Our evaluation included pregnancy test that was negative, as well as a pelvic exam and labs that were reassuring against significantly threatening disease.  He should follow-up with your primary care doctor next couple days, as well as with the OB/GYN clinic was number we attached above.  Please return to the ER for any worsening symptoms.

## 2021-01-22 ENCOUNTER — Telehealth: Payer: Self-pay

## 2021-01-22 LAB — GC/CHLAMYDIA PROBE AMP (~~LOC~~) NOT AT ARMC
Chlamydia: NEGATIVE
Comment: NEGATIVE
Comment: NORMAL
Neisseria Gonorrhea: NEGATIVE

## 2021-01-22 NOTE — Telephone Encounter (Signed)
Transition Care Management Follow-up Telephone Call Date of discharge and from where: 01/21/2021-[facility] ED How have you been since you were released from the hospital? Feeling better Any questions or concerns? No  Items Reviewed: Did the pt receive and understand the discharge instructions provided?  yes Medications obtained and verified?  No medications given  Other? No  Any new allergies since your discharge? No  Dietary orders reviewed? No Do you have support at home? Yes   Home Care and Equipment/Supplies: Were home health services ordered? not applicable If so, what is the name of the agency? N/A  Has the agency set up a time to come to the patient's home? not applicable Were any new equipment or medical supplies ordered?  No What is the name of the medical supply agency? N/A Were you able to get the supplies/equipment? not applicable Do you have any questions related to the use of the equipment or supplies? No  Functional Questionnaire: (I = Independent and D = Dependent) ADLs: I  Bathing/Dressing- I  Meal Prep- I  Eating- I  Maintaining continence- I  Transferring/Ambulation- I  Managing Meds- I  Follow up appointments reviewed:  PCP Hospital f/u appt confirmed? No  Advised to call and schedule hospital follow up with [facility] f/u appt confirmed? No  Advised to follow up with Specialist  Are transportation arrangements needed? No  If their condition worsens, is the pt aware to call PCP or go to the Emergency Dept.? Yes Was the patient provided with contact information for the PCP's office or ED? Yes Was to pt encouraged to call back with questions or concerns? Yes

## 2021-01-22 NOTE — Telephone Encounter (Signed)
Transition Care Management Unsuccessful Follow-up Telephone Call  Date of discharge and from where:  01/21/2021- [facility] ED  Attempts:  1st Attempt  Reason for unsuccessful TCM follow-up call:  Left voice message    

## 2021-05-20 ENCOUNTER — Encounter (HOSPITAL_COMMUNITY): Payer: Self-pay

## 2021-05-20 ENCOUNTER — Inpatient Hospital Stay (HOSPITAL_COMMUNITY)
Admission: AD | Admit: 2021-05-20 | Discharge: 2021-05-20 | Disposition: A | Discharge status: Home / Self Care | Payer: Medicaid Other | Attending: Obstetrics and Gynecology | Admitting: Obstetrics and Gynecology

## 2021-05-20 DIAGNOSIS — R112 Nausea with vomiting, unspecified: Secondary | ICD-10-CM | POA: Diagnosis not present

## 2021-05-20 DIAGNOSIS — R109 Unspecified abdominal pain: Secondary | ICD-10-CM | POA: Diagnosis not present

## 2021-05-20 DIAGNOSIS — Z3202 Encounter for pregnancy test, result negative: Secondary | ICD-10-CM | POA: Insufficient documentation

## 2021-05-20 LAB — POCT PREGNANCY, URINE: Preg Test, Ur: NEGATIVE

## 2021-05-20 NOTE — MAU Note (Signed)
..  Sandra Roach is a 20 y.o. at Unknown here in MAU reporting: She has not had a positive pregnancy test at home. Reports a "balling up" pain in her upper abdomen for over a month and states she has been throwing up for this long. Denies vaginal bleeding.  LMP: March 05, 2021 Pain score: 7/10 Vitals:   05/20/21 2014  BP: 129/69  Pulse: 80  Resp: 15  Temp: 98.9 F (37.2 C)  SpO2: 100%      Lab orders placed from triage: none

## 2021-05-20 NOTE — MAU Provider Note (Signed)
Event Date/Time   First Provider Initiated Contact with Patient 05/20/21 2032      S Ms. Sandra Roach is a 20 y.o. G1P0 patient who presents to MAU today with complaint of abdominal pain & n/v. Symptoms started over a month ago. LMP was 2 months ago. No lower abdominal pain or vaginal bleeding. Has not had a positive home pregnancy test. Does not have a local PCP.  O BP 129/69 (BP Location: Right Arm)   Pulse 80   Temp 98.9 F (37.2 C) (Oral)   Resp 15   Ht 5\' 5"  (1.651 m)   Wt 79.3 kg   LMP  (Approximate)   SpO2 100%   Breastfeeding Unknown   BMI 29.10 kg/m  Physical Exam Vitals and nursing note reviewed.  Constitutional:      Appearance: Normal appearance. She is not ill-appearing.  HENT:     Head: Normocephalic and atraumatic.  Eyes:     General: No scleral icterus. Pulmonary:     Effort: Pulmonary effort is normal. No respiratory distress.  Neurological:     General: No focal deficit present.     Mental Status: She is alert.  Psychiatric:        Mood and Affect: Mood normal.        Behavior: Behavior normal.    A Medical screening exam complete 1. Negative pregnancy test      P Discharge from MAU in stable condition Establish with PCP Discussed reasons to go to urgent care or ED  , NP 05/20/2021 8:33 PM

## 2021-07-19 ENCOUNTER — Other Ambulatory Visit: Payer: Self-pay

## 2021-07-19 ENCOUNTER — Encounter (HOSPITAL_COMMUNITY): Payer: Self-pay | Admitting: Emergency Medicine

## 2021-07-19 ENCOUNTER — Telehealth: Payer: Medicaid Other | Admitting: Emergency Medicine

## 2021-07-19 ENCOUNTER — Ambulatory Visit (HOSPITAL_COMMUNITY)
Admission: EM | Admit: 2021-07-19 | Discharge: 2021-07-19 | Disposition: A | Discharge status: Home / Self Care | Payer: Medicaid Other | Attending: Emergency Medicine | Admitting: Emergency Medicine

## 2021-07-19 DIAGNOSIS — H938X2 Other specified disorders of left ear: Secondary | ICD-10-CM | POA: Diagnosis not present

## 2021-07-19 DIAGNOSIS — H6982 Other specified disorders of Eustachian tube, left ear: Secondary | ICD-10-CM | POA: Diagnosis not present

## 2021-07-19 DIAGNOSIS — H9202 Otalgia, left ear: Secondary | ICD-10-CM

## 2021-07-19 MED ORDER — CETIRIZINE HCL 10 MG PO TABS: 10.0000 mg | ORAL_TABLET | Freq: Every day | ORAL | 0 refills | Status: AC

## 2021-07-19 NOTE — ED Triage Notes (Signed)
Left ear pain and clogged x 3 days.

## 2021-07-19 NOTE — Discharge Instructions (Addendum)
Your are being treated for eustachian tube dysfunction, more information is inside your packet Your ear does not appear to be infected   Take zyrtec once a day to help reduce secretions  You may over the counter Flonase nasal spray every morning  You may use over the counter Phenylphrine spray throughout day to help decrease secretions  May follow up with urgent care as needed for persistent symptoms

## 2021-07-19 NOTE — ED Provider Notes (Signed)
[facility]    CSN: [id number] Arrival date & time: 07/19/21  1658      History   Chief Complaint Chief Complaint  Patient presents with   Ear Fullness    HPI Sandra Roach is a 21 y.o. female.   Patient presents with sensation of the left ear feeling clogged, ear fullness for 3 days.  Endorses that symptoms worsened today and became painful.  Has attempted use of over-the-counter eardrops which were not effective.  Denies decreased hearing, itching, drainage, fever, chills, URI symptoms.    Past Medical History:  Diagnosis Date   Constipation     There are no problems to display for this patient.   History reviewed. No pertinent surgical history.  OB History     Gravida  1   Para      Term      Preterm      AB      Living         SAB      IAB      Ectopic      Multiple      Live Births               Home Medications    Prior to Admission medications   Medication Sig Start Date End Date Taking? Authorizing Provider  acetaminophen (TYLENOL) 325 MG tablet Take 650 mg by mouth every 6 (six) hours as needed.    [provider]    Family History Family History  Problem Relation Age of Onset   Cancer Mother     Social History Social History   Tobacco Use   Smoking status: Never   Smokeless tobacco: Never  Vaping Use   Vaping Use: Never used  Substance Use Topics   Alcohol use: Yes   Drug use: Yes    Types: Marijuana     Allergies   Patient has no known allergies.   Review of Systems Review of Systems  Constitutional: Negative.   HENT:  Positive for ear pain. Negative for congestion, dental problem, drooling, ear discharge, facial swelling, hearing loss, mouth sores, nosebleeds, postnasal drip, rhinorrhea, sinus pressure, sinus pain, sneezing, sore throat, tinnitus, trouble swallowing and voice change.   Respiratory: Negative.    Cardiovascular: Negative.   Skin: Negative.   Neurological: Negative.      Physical Exam Triage Vital Signs ED Triage Vitals  Enc Vitals Group     BP 07/19/21 1754 119/76     Pulse Rate 07/19/21 1754 67     Resp 07/19/21 1754 15     Temp 07/19/21 1754 98.7 F (37.1 C)     Temp Source 07/19/21 1754 Oral     SpO2 07/19/21 1754 98 %     Weight --      Height --      Head Circumference --      Peak Flow --      Pain Score 07/19/21 1753 8     Pain Loc --      Pain Edu? --      Excl. in GC? --    No data found.  Updated Vital Signs BP 119/76    Pulse 67    Temp 98.7 F (37.1 C) (Oral)    Resp 15    LMP 07/18/2021    SpO2 98%   Visual Acuity Right Eye Distance:   Left Eye Distance:   Bilateral Distance:    Right Eye Near:   Left  Eye Near:    Bilateral Near:     Physical Exam Constitutional:      Appearance: Normal appearance. She is normal weight.  HENT:     Right Ear: Hearing, tympanic membrane, ear canal and external ear normal.     Left Ear: Hearing, ear canal and external ear normal. A middle ear effusion is present.  Eyes:     Extraocular Movements: Extraocular movements intact.  Pulmonary:     Effort: Pulmonary effort is normal.  Skin:    General: Skin is warm and dry.  Neurological:     Mental Status: She is alert and oriented to person, place, and time. Mental status is at baseline.  Psychiatric:        Mood and Affect: Mood normal.        Behavior: Behavior normal.     UC Treatments / Results  Labs (all labs ordered are listed, but only abnormal results are displayed) Labs Reviewed - No data to display  EKG   Radiology No results found.  Procedures Procedures (including critical care time)  Medications Ordered in UC Medications - No data to display  Initial Impression / Assessment and Plan / UC Course  I have reviewed the triage vital signs and the nursing notes.  Pertinent labs & imaging results that were available during my care of the patient were reviewed by me and considered in my medical decision  making (see chart for details).  Tearfulness Acute dysfunction of the left eustachian tube  Discussed etiology of symptoms, timeline and possible resolution, prescribed Zyrtec for daily use, recommended over-the-counter Flonase and Phenylphrine nasal spray to help minimize secretions, over-the-counter pain medications for management of discomfort, urgent care follow-up as needed Final Clinical Impressions(s) / UC Diagnoses   Final diagnoses:  None   Discharge Instructions   None    ED Prescriptions   None    PDMP not reviewed this encounter.   Valinda Hoar, NP 07/19/21 Rickey Primus

## 2021-07-19 NOTE — Progress Notes (Signed)
Virtual Visit Consent   Sandra Roach, you are scheduled for a virtual visit with a [facility] provider today.     Just as with appointments in the office, your consent must be obtained to participate.  Your consent will be active for this visit and any virtual visit you may have with one of our providers in the next 365 days.     If you have a MyChart account, a copy of this consent can be sent to you electronically.  All virtual visits are billed to your insurance company just like a traditional visit in the office.    As this is a virtual visit, video technology does not allow for your provider to perform a traditional examination.  This may limit your provider's ability to fully assess your condition.  If your provider identifies any concerns that need to be evaluated in person or the need to arrange testing (such as labs, EKG, etc.), we will make arrangements to do so.     Although advances in technology are sophisticated, we cannot ensure that it will always work on either your end or our end.  If the connection with a video visit is poor, the visit may have to be switched to a telephone visit.  With either a video or telephone visit, we are not always able to ensure that we have a secure connection.     I need to obtain your verbal consent now.   Are you willing to proceed with your visit today?    Lakynn Halvorsen has provided verbal consent on 07/19/2021 for a virtual visit video.   Roxy Horseman, PA-C   Date: 07/19/2021 3:41 PM   Virtual Visit via Video Note   I, Roxy Horseman, connected with  Sandra Roach  ([id number], [birth year]) on 07/19/21 at  3:45 PM EST by a video-enabled telemedicine application and verified that I am speaking with the correct person using two identifiers.  Location: Patient: Virtual Visit Location Patient: Home Provider: Virtual Visit Location Provider: Home Office   I discussed the limitations of evaluation and management by telemedicine and the  availability of in person appointments. The patient expressed understanding and agreed to proceed.    History of Present Illness: Sandra Roach is a 21 y.o. who identifies as a female who was assigned female at birth, and is being seen today for ear pain.  States that he has left ear pain.  Onset 2 day ago.  States that she has tried some ear drops with now relief.  States she can't tell if it's just ear wax blocking the ear canal.  HPI: HPI  Problems: There are no problems to display for this patient.   Allergies: No Known Allergies Medications:  Current Outpatient Medications:    acetaminophen (TYLENOL) 325 MG tablet, Take 650 mg by mouth every 6 (six) hours as needed., Disp: , Rfl:   Observations/Objective: Patient is well-developed, well-nourished in no acute distress.  Resting comfortably at home.  Head is normocephalic, atraumatic.  No labored breathing. Speech is clear and coherent with logical content.  Patient is alert and oriented at baseline.    Assessment and Plan: 1. Left ear pain  - Left ear pain, sounds more like cerumen impaction, but infection is not ruled out.  She'll need otoscopic exam to confirm.  Follow Up Instructions: I discussed the assessment and treatment plan with the patient. The patient was provided an opportunity to ask questions and all were answered. The patient agreed with the plan and  demonstrated an understanding of the instructions.  A copy of instructions were sent to the patient via MyChart unless otherwise noted below.     The patient was advised to call back or seek an in-person evaluation if the symptoms worsen or if the condition fails to improve as anticipated.  Time:  I spent 5 minutes with the patient via telehealth technology discussing the above problems/concerns.    Roxy Horseman, PA-C

## 2021-07-19 NOTE — Patient Instructions (Signed)
Based on what you shared with me, I feel your condition warrants further evaluation and I recommend that you be seen in a face to face visit. °  °NOTE: There will be NO CHARGE for this Visit °  °If you are having a true medical emergency please call 911.   °  ° For an urgent face to face visit, [facility] has six urgent care centers for your convenience:  °  ° [facility] [place] °Get Driving Directions °[phone] °[address] °, [place] [address] °  ° [facility] ([facility]) °Get Driving Directions °[phone] °[address] °[place], [place] [address] ° °[facility] [place]) °Get Driving Directions °[phone] °[address] °,  [place]  [address] ° °[facility] °Get Driving Directions °[phone] °[address], [address] °[place], [place] [address] °  ° [facility] [place] °Get Driving Directions  °[phone] °[address] °[address] °[place], [place] [address] °  ° [facility] at [facility] °Get Driving Directions °[phone] °[address] °, [place] [address] ° ° °

## 2022-02-05 DIAGNOSIS — Z3201 Encounter for pregnancy test, result positive: Secondary | ICD-10-CM | POA: Diagnosis not present

## 2022-02-26 DIAGNOSIS — N925 Other specified irregular menstruation: Secondary | ICD-10-CM | POA: Diagnosis not present

## 2022-02-26 DIAGNOSIS — Z3A09 9 weeks gestation of pregnancy: Secondary | ICD-10-CM | POA: Diagnosis not present

## 2022-02-26 DIAGNOSIS — Z113 Encounter for screening for infections with a predominantly sexual mode of transmission: Secondary | ICD-10-CM | POA: Diagnosis not present

## 2022-02-26 DIAGNOSIS — Z362 Encounter for other antenatal screening follow-up: Secondary | ICD-10-CM | POA: Diagnosis not present

## 2022-02-26 DIAGNOSIS — O3680X9 Pregnancy with inconclusive fetal viability, other fetus: Secondary | ICD-10-CM | POA: Diagnosis not present

## 2022-02-26 DIAGNOSIS — R112 Nausea with vomiting, unspecified: Secondary | ICD-10-CM | POA: Diagnosis not present

## 2022-02-26 LAB — OB RESULTS CONSOLE RUBELLA ANTIBODY, IGM: Rubella: IMMUNE

## 2022-02-26 LAB — OB RESULTS CONSOLE HIV ANTIBODY (ROUTINE TESTING): HIV: NONREACTIVE

## 2022-02-26 LAB — HEPATITIS C ANTIBODY: HCV Ab: NEGATIVE

## 2022-02-26 LAB — OB RESULTS CONSOLE HEPATITIS B SURFACE ANTIGEN: Hepatitis B Surface Ag: NEGATIVE

## 2022-03-04 LAB — OB RESULTS CONSOLE GC/CHLAMYDIA
Chlamydia: NEGATIVE
Neisseria Gonorrhea: NEGATIVE

## 2022-03-06 ENCOUNTER — Emergency Department (HOSPITAL_COMMUNITY)
Admission: EM | Admit: 2022-03-06 | Discharge: 2022-03-06 | Disposition: A | Discharge status: Home / Self Care | Payer: Medicaid Other | Attending: Emergency Medicine | Admitting: Emergency Medicine

## 2022-03-06 ENCOUNTER — Other Ambulatory Visit: Payer: Self-pay

## 2022-03-06 DIAGNOSIS — R519 Headache, unspecified: Secondary | ICD-10-CM | POA: Diagnosis not present

## 2022-03-06 DIAGNOSIS — R509 Fever, unspecified: Secondary | ICD-10-CM | POA: Diagnosis not present

## 2022-03-06 DIAGNOSIS — Z3A10 10 weeks gestation of pregnancy: Secondary | ICD-10-CM | POA: Diagnosis not present

## 2022-03-06 DIAGNOSIS — O210 Mild hyperemesis gravidarum: Secondary | ICD-10-CM | POA: Diagnosis not present

## 2022-03-06 DIAGNOSIS — Z20822 Contact with and (suspected) exposure to covid-19: Secondary | ICD-10-CM | POA: Insufficient documentation

## 2022-03-06 DIAGNOSIS — O219 Vomiting of pregnancy, unspecified: Secondary | ICD-10-CM | POA: Insufficient documentation

## 2022-03-06 DIAGNOSIS — M791 Myalgia, unspecified site: Secondary | ICD-10-CM | POA: Diagnosis not present

## 2022-03-06 DIAGNOSIS — R112 Nausea with vomiting, unspecified: Secondary | ICD-10-CM | POA: Diagnosis not present

## 2022-03-06 LAB — CBC WITH DIFFERENTIAL/PLATELET
Abs Immature Granulocytes: 0.02 10*3/uL (ref 0.00–0.07)
Basophils Absolute: 0 10*3/uL (ref 0.0–0.1)
Basophils Relative: 0 %
Eosinophils Absolute: 0.1 10*3/uL (ref 0.0–0.5)
Eosinophils Relative: 2 %
HCT: 41.2 % (ref 36.0–46.0)
Hemoglobin: 14 g/dL (ref 12.0–15.0)
Immature Granulocytes: 0 %
Lymphocytes Relative: 21 %
Lymphs Abs: 1.3 10*3/uL (ref 0.7–4.0)
MCH: 31.3 pg (ref 26.0–34.0)
MCHC: 34 g/dL (ref 30.0–36.0)
MCV: 92 fL (ref 80.0–100.0)
Monocytes Absolute: 0.3 10*3/uL (ref 0.1–1.0)
Monocytes Relative: 5 %
Neutro Abs: 4.3 10*3/uL (ref 1.7–7.7)
Neutrophils Relative %: 72 %
Platelets: 223 10*3/uL (ref 150–400)
RBC: 4.48 MIL/uL (ref 3.87–5.11)
RDW: 12 % (ref 11.5–15.5)
WBC: 6.1 10*3/uL (ref 4.0–10.5)
nRBC: 0 % (ref 0.0–0.2)

## 2022-03-06 LAB — URINALYSIS, ROUTINE W REFLEX MICROSCOPIC
Bilirubin Urine: NEGATIVE
Glucose, UA: NEGATIVE mg/dL
Hgb urine dipstick: NEGATIVE
Ketones, ur: NEGATIVE mg/dL
Nitrite: NEGATIVE
Protein, ur: 30 mg/dL — AB
Specific Gravity, Urine: 1.027 (ref 1.005–1.030)
pH: 7 (ref 5.0–8.0)

## 2022-03-06 LAB — COMPREHENSIVE METABOLIC PANEL
ALT: 12 U/L (ref 0–44)
AST: 17 U/L (ref 15–41)
Albumin: 3.4 g/dL — ABNORMAL LOW (ref 3.5–5.0)
Alkaline Phosphatase: 46 U/L (ref 38–126)
Anion gap: 9 (ref 5–15)
BUN: 6 mg/dL (ref 6–20)
CO2: 21 mmol/L — ABNORMAL LOW (ref 22–32)
Calcium: 9.1 mg/dL (ref 8.9–10.3)
Chloride: 104 mmol/L (ref 98–111)
Creatinine, Ser: 0.69 mg/dL (ref 0.44–1.00)
GFR, Estimated: >60 mL/min (ref >60)
Glucose, Bld: 133 mg/dL — ABNORMAL HIGH (ref 70–99)
Potassium: 3.5 mmol/L (ref 3.5–5.1)
Sodium: 134 mmol/L — ABNORMAL LOW (ref 135–145)
Total Bilirubin: 0.4 mg/dL (ref 0.3–1.2)
Total Protein: 6.6 g/dL (ref 6.5–8.1)

## 2022-03-06 LAB — RESP PANEL BY RT-PCR (FLU A&B, COVID) ARPGX2
Influenza A by PCR: NEGATIVE
Influenza B by PCR: NEGATIVE
SARS Coronavirus 2 by RT PCR: NEGATIVE

## 2022-03-06 LAB — HCG, QUANTITATIVE, PREGNANCY: hCG, Beta Chain, Quant, S: 238454 m[IU]/mL — ABNORMAL HIGH (ref <5)

## 2022-03-06 LAB — LIPASE, BLOOD: Lipase: 26 U/L (ref 11–51)

## 2022-03-06 MED ORDER — PROCHLORPERAZINE EDISYLATE 10 MG/2ML IJ SOLN
5.0000 mg | Freq: Once | INTRAMUSCULAR | Status: AC
  Administered 2022-03-06: 5 mg via INTRAVENOUS
  Filled 2022-03-06: qty 2

## 2022-03-06 MED ORDER — ACETAMINOPHEN 500 MG PO TABS
1000.0000 mg | ORAL_TABLET | Freq: Once | ORAL | Status: AC
  Administered 2022-03-06: 1000 mg via ORAL
  Filled 2022-03-06: qty 2

## 2022-03-06 MED ORDER — DIPHENHYDRAMINE HCL 25 MG PO CAPS
25.0000 mg | ORAL_CAPSULE | Freq: Once | ORAL | Status: AC
  Administered 2022-03-06: 25 mg via ORAL
  Filled 2022-03-06: qty 1

## 2022-03-06 MED ORDER — ONDANSETRON 4 MG PO TBDP: 4.0000 mg | ORAL_TABLET | Freq: Three times a day (TID) | ORAL | 0 refills | Status: DC | PRN

## 2022-03-06 MED ORDER — LACTATED RINGERS IV BOLUS
1000.0000 mL | Freq: Once | INTRAVENOUS | Status: AC
  Administered 2022-03-06: 1000 mL via INTRAVENOUS

## 2022-03-06 MED ORDER — ONDANSETRON 4 MG PO TBDP
4.0000 mg | ORAL_TABLET | Freq: Once | ORAL | Status: AC
  Administered 2022-03-06: 4 mg via ORAL
  Filled 2022-03-06: qty 1

## 2022-03-06 NOTE — Discharge Instructions (Addendum)
You were seen in the emergency department today for nausea and vomiting.  He would also describe symptoms that are typical of a virus.  We gave you some fluids to the IV, did blood work, and gave you antinausea medicines.  We think you are safe to go home at this time.  We are giving a prescription for Zofran.  Please take it as needed and as prescribed.  If you are still unable to keep anything down by mouth please come back to the emergency department.  Otherwise would like you to follow-up with your regular doctor and your obstetrics and gynecology physician.

## 2022-03-06 NOTE — ED Triage Notes (Signed)
Pt states she is about [redacted] weeks pregnant   has had a lot of vomiting and feels she is seeing blood in her vomit. Also endorses body aches, headache, and light sensitivity.  Feels she may be dehydrated.  No fever or chills.  No CP or SHOB

## 2022-03-06 NOTE — ED Provider Notes (Signed)
[facility] EMERGENCY DEPARTMENT Provider Note   CSN: [id number] Arrival date & time: 03/06/22  1219     History  Chief Complaint  Patient presents with   Emesis During Pregnancy   Headache    Sandra Roach is a 21 y.o. female who is reportedly [redacted] weeks pregnant per patient presents to the emergency department with body aches, headache, fever, chills, nausea and vomiting.  Patient states that she has no known sick contacts although approximately 3 to 4 days prior she had developed a fever to a Tmax of 103.  She states that she has not taken her temperature since.  She has had associated generalized body aches, gradual onset headache that has persisted, nausea and vomiting.  She denies any vaginal bleeding.  She states that she had her initial OB appointment approximately 2 to 3 weeks ago and she had a ultrasound at this time.  Patient denies visual changes associated with her headache.  She has a history of migraines and states that this is very similar to her prior migraines.  She states that she has had some bright red blood streaks in her emesis lately although no gross hematemesis or coffee-ground emesis. HPI     Home Medications Prior to Admission medications   Medication Sig Start Date End Date Taking? Authorizing Provider  acetaminophen (TYLENOL) 325 MG tablet Take 650 mg by mouth every 6 (six) hours as needed.    [provider]  cetirizine (ZYRTEC ALLERGY) 10 MG tablet Take 1 tablet (10 mg total) by mouth daily. 07/19/21   Hans Eden, NP      Allergies    Patient has no known allergies.    Review of Systems   Review of Systems  Constitutional:  Positive for fever. Negative for chills.  HENT:  Negative for ear pain and sore throat.   Eyes:  Negative for pain and visual disturbance.  Respiratory:  Negative for cough and shortness of breath.   Cardiovascular:  Negative for chest pain and palpitations.  Gastrointestinal:  Positive for  vomiting. Negative for abdominal pain.  Genitourinary:  Negative for dysuria and hematuria.  Musculoskeletal:  Positive for myalgias. Negative for arthralgias and back pain.  Skin:  Negative for color change and rash.  Neurological:  Negative for seizures and syncope.  All other systems reviewed and are negative.   Physical Exam Updated Vital Signs BP 131/73 (BP Location: Right Arm)   Pulse 65   Temp 98.2 F (36.8 C) (Oral)   Resp 16   SpO2 99%  Physical Exam Vitals and nursing note reviewed.  Constitutional:      General: She is not in acute distress.    Appearance: She is well-developed.     Comments: Upon entering the exam the patient is lying in bed awake and alert no acute distress.  HENT:     Head: Normocephalic and atraumatic.     Mouth/Throat:     Mouth: Mucous membranes are moist.  Eyes:     Conjunctiva/sclera: Conjunctivae normal.  Cardiovascular:     Rate and Rhythm: Normal rate and regular rhythm.     Heart sounds: No murmur heard.    Comments: Regular rate and rhythm no murmurs or gallops. Pulmonary:     Effort: Pulmonary effort is normal. No respiratory distress.     Breath sounds: Normal breath sounds.     Comments: Lungs clear to auscultation bilaterally.  Speaking in full sentences without difficulty. Abdominal:     Palpations:  Abdomen is soft.     Tenderness: There is no abdominal tenderness.     Comments: Soft nondistended nontender  Musculoskeletal:        General: No swelling.     Cervical back: Neck supple.     Comments: Lower extremities are symmetric and nonedematous  Skin:    General: Skin is warm and dry.     Capillary Refill: Capillary refill takes less than 2 seconds.  Neurological:     Mental Status: She is alert.     Comments: Fully alert and oriented moving all extremity spontaneously grossly neurologically intact  Cranial nerves II through XII are intact.  Patient has intact and symmetric strength in the upper extremities with 5 out  of 5 grip strength.  5/5 flexion bilaterally.  Symmetric sensation in the upper and lower extremities.  Patient ambulated in the emergency department without difficulty.    Psychiatric:        Mood and Affect: Mood normal.     ED Results / Procedures / Treatments   Labs (all labs ordered are listed, but only abnormal results are displayed) Labs Reviewed  COMPREHENSIVE METABOLIC PANEL - Abnormal; Notable for the following components:      Result Value   Sodium 134 (*)    CO2 21 (*)    Glucose, Bld 133 (*)    Albumin 3.4 (*)    All other components within normal limits  URINALYSIS, ROUTINE W REFLEX MICROSCOPIC - Abnormal; Notable for the following components:   Color, Urine AMBER (*)    APPearance CLOUDY (*)    Protein, ur 30 (*)    Leukocytes,Ua SMALL (*)    Bacteria, UA FEW (*)    All other components within normal limits  HCG, QUANTITATIVE, PREGNANCY - Abnormal; Notable for the following components:   hCG, Beta Chain, Quant, [place] 132,440 (*)    All other components within normal limits  CBC WITH DIFFERENTIAL/PLATELET  LIPASE, BLOOD    EKG None  Radiology No results found.  Procedures Procedures    Medications Ordered in ED Medications  acetaminophen (TYLENOL) tablet 1,000 mg (1,000 mg Oral Given 03/06/22 1357)  ondansetron (ZOFRAN-ODT) disintegrating tablet 4 mg (4 mg Oral Given 03/06/22 1357)    ED Course/ Medical Decision Making/ A&P Clinical Course as of 03/06/22 1913  Thu Mar 06, 2022  1911 21 yo female at approx [redacted] weeks gestation here with nausea/vomiting/chills, dry cough.  Likely viral syndromes.  Vitals wnl.  WBC wnl.  Doubt sepsis.  Plan to treat with with iv migraine cocktail, meds, for pt reporting headache and nausea.  Zofran at triage had little effect.  Doubt PID.  No indication for emergent ultrasound at this time.  No vaginal bleeding/pelvic cramping. [MT]    Clinical Course User Index [MT] Trifan, Kermit Balo, MD                           Medical  Decision Making Risk Prescription drug management.   Patient presents emergency department hemodynamically stable, afebrile and with several days of persistent nausea, vomiting, 1 day of fever, generalized body aches and an unremarkable physical exam.  Differential diagnosis includes viral syndrome versus hyperemesis gravidarum.  Patient reports official ultrasound with appropriate findings and thus do not feel as though additional work-up regarding complications of early pregnancy is required.  Given the gradual onset of the patient's headache in addition to her benign neurologic exam do not feel as though head  imaging is required to assess for intracranial hemorrhage.  We will provide 1 L lactated Ringer's, intravenous Compazine, oral Benadryl, and reassess.  Additional reassessment feels the patient feels much better and that she would like to go home at this time.  Feel this is appropriate.  Will provide prescription for Zofran and instructions for the patient to return if she is unable to tolerate oral intake.  The patient was instructed to follow-up with her obstetrician gynecology physician in the near future.        Final Clinical Impression(s) / ED Diagnoses Final diagnoses:  Hyperemesis gravidarum    Rx / DC Orders ED Discharge Orders     None         Duard Brady, MD 03/06/22 2236    Terald Sleeper, MD 03/07/22 1421

## 2022-03-06 NOTE — ED Provider Triage Note (Signed)
Emergency Medicine Provider Triage Evaluation Note  Paulene Tayag , a 21 y.o. female  was evaluated in triage.  Pt complains of pt is [redacted] weeks pregnant. Has has hyperemesis gravidarum throughout her pregnancy now here with headache, light/sound sensitivity, feeling dehydrated, nausea/vomiting, seeing streak of blood in her vomit, chills.  Denies fever, neck stiffness, dysuria  Review of Systems  Positive: As above Negative: As above  Physical Exam  BP 130/72 (BP Location: Right Arm)   Pulse 72   Temp 98.9 F (37.2 C) (Oral)   Resp 18   SpO2 99%  Gen:   Awake, no distress   Resp:  Normal effort  MSK:   Moves extremities without difficulty  Other:    Medical Decision Making  Medically screening exam initiated at 1:03 PM.  Appropriate orders placed.  Kiaraliz Rafuse was informed that the remainder of the evaluation will be completed by another provider, this initial triage assessment does not replace that evaluation, and the importance of remaining in the ED until their evaluation is complete.     Fayrene Helper, PA-C 03/06/22 1315

## 2022-03-16 ENCOUNTER — Inpatient Hospital Stay (HOSPITAL_COMMUNITY)
Admission: AD | Admit: 2022-03-16 | Discharge: 2022-03-17 | Disposition: A | Discharge status: Home / Self Care | Payer: Medicaid Other | Attending: Obstetrics & Gynecology | Admitting: Obstetrics & Gynecology

## 2022-03-16 ENCOUNTER — Encounter (HOSPITAL_COMMUNITY): Payer: Self-pay

## 2022-03-16 DIAGNOSIS — G4486 Cervicogenic headache: Secondary | ICD-10-CM | POA: Insufficient documentation

## 2022-03-16 DIAGNOSIS — O26891 Other specified pregnancy related conditions, first trimester: Secondary | ICD-10-CM | POA: Insufficient documentation

## 2022-03-16 DIAGNOSIS — Z3A11 11 weeks gestation of pregnancy: Secondary | ICD-10-CM | POA: Insufficient documentation

## 2022-03-16 LAB — URINALYSIS, ROUTINE W REFLEX MICROSCOPIC
Bilirubin Urine: NEGATIVE
Glucose, UA: NEGATIVE mg/dL
Hgb urine dipstick: NEGATIVE
Ketones, ur: 80 mg/dL — AB
Nitrite: NEGATIVE
Protein, ur: NEGATIVE mg/dL
Specific Gravity, Urine: 1.028 (ref 1.005–1.030)
pH: 5 (ref 5.0–8.0)

## 2022-03-16 MED ORDER — METOCLOPRAMIDE HCL 5 MG/ML IJ SOLN
10.0000 mg | Freq: Once | INTRAMUSCULAR | Status: DC
  Filled 2022-03-16: qty 2

## 2022-03-16 MED ORDER — LACTATED RINGERS IV BOLUS: 1000.0000 mL | Freq: Once | INTRAVENOUS | Status: DC

## 2022-03-16 MED ORDER — DIPHENHYDRAMINE HCL 50 MG/ML IJ SOLN
25.0000 mg | Freq: Once | INTRAMUSCULAR | Status: DC
  Filled 2022-03-16: qty 1

## 2022-03-16 NOTE — MAU Note (Signed)
.  Sandra Roach is a 21 y.o. at Unknown here in MAU reporting: HA for the last 3 days with increased pain.Pt reports pain is worse with talking, light, and moving. Pt report taking Tylenol and Motrin, both with no relief, nothing today. Pt denies VB, LOF, abnormal discharge. PT denies hx of HA or migraines.  LMP: Ukn Onset of complaint: 2 days ago Pain score: 10/10 Vitals:   03/16/22 2056  BP: 127/70  Pulse: 75  Resp: 18  Temp: 98.4 F (36.9 C)  SpO2: 100%      Lab orders placed from triage:  UA

## 2022-03-16 NOTE — MAU Note (Signed)
2 MAU RN'S attempted pt's IV. Notified pt that another RN would need to try her IV. Pt refused. Provider Dr. Ladon Applebaum made aware

## 2022-03-17 ENCOUNTER — Inpatient Hospital Stay (HOSPITAL_COMMUNITY): Payer: Medicaid Other

## 2022-03-17 DIAGNOSIS — Z3A11 11 weeks gestation of pregnancy: Secondary | ICD-10-CM

## 2022-03-17 DIAGNOSIS — G4486 Cervicogenic headache: Secondary | ICD-10-CM

## 2022-03-17 DIAGNOSIS — O26891 Other specified pregnancy related conditions, first trimester: Secondary | ICD-10-CM | POA: Diagnosis not present

## 2022-03-17 DIAGNOSIS — J322 Chronic ethmoidal sinusitis: Secondary | ICD-10-CM | POA: Diagnosis not present

## 2022-03-17 DIAGNOSIS — I629 Nontraumatic intracranial hemorrhage, unspecified: Secondary | ICD-10-CM | POA: Diagnosis not present

## 2022-03-17 DIAGNOSIS — J320 Chronic maxillary sinusitis: Secondary | ICD-10-CM | POA: Diagnosis not present

## 2022-03-17 DIAGNOSIS — J323 Chronic sphenoidal sinusitis: Secondary | ICD-10-CM | POA: Diagnosis not present

## 2022-03-17 MED ORDER — CYCLOBENZAPRINE HCL 5 MG PO TABS
5.0000 mg | ORAL_TABLET | Freq: Once | ORAL | Status: AC
  Administered 2022-03-17: 5 mg via ORAL
  Filled 2022-03-17: qty 1

## 2022-03-17 MED ORDER — CYCLOBENZAPRINE HCL 5 MG PO TABS
5.0000 mg | ORAL_TABLET | Freq: Three times a day (TID) | ORAL | 0 refills | Status: DC | PRN
Start: 2022-03-17 — End: 2022-06-15

## 2022-03-17 MED ORDER — ACETAMINOPHEN 500 MG PO TABS
1000.0000 mg | ORAL_TABLET | Freq: Four times a day (QID) | ORAL | Status: DC | PRN
  Administered 2022-03-17: 1000 mg via ORAL
  Filled 2022-03-17: qty 2

## 2022-03-17 NOTE — Discharge Instructions (Addendum)
Please keep well hydrated.  Take flexeril as needed to help with h/ache.   Follow up with your OB provider for routine OB

## 2022-03-17 NOTE — MAU Note (Signed)
Pt back from MRI 

## 2022-03-20 NOTE — MAU Provider Note (Signed)
History     [id number]  Arrival date and time: 03/16/22 2024    Chief Complaint  Patient presents with   Headache    HPI Sandra Roach is a 21 y.o. at [redacted]w[redacted]d by LMP here with a headache. Symptoms have been on for the last 3 pain, worsening. No vision changes. +photosensitive, phonosenstive. No prior history of migraines or chronic h/aches. No recent stressors outside of new pregnancy and has been staying hydrated..  She has tried both tylenol and motrin, with no improvement.  No focal body weakness.  Vaginal bleeding: No LOF: No No abnormal vaginal discharge.  OB History     Gravida  2   Para  1   Term  1   Preterm      AB      Living  1      SAB      IAB      Ectopic      Multiple      Live Births  1           Past Medical History:  Diagnosis Date   Constipation     History reviewed. No pertinent surgical history.  Family History  Problem Relation Age of Onset   Cancer Mother     Social History   Socioeconomic History   Marital status: Single    Spouse name: Not on file   Number of children: Not on file   Years of education: Not on file   Highest education level: Not on file  Occupational History   Not on file  Tobacco Use   Smoking status: Never   Smokeless tobacco: Never  Vaping Use   Vaping Use: Never used  Substance and Sexual Activity   Alcohol use: Yes   Drug use: Not Currently    Types: Marijuana   Sexual activity: Yes    Birth control/protection: None  Other Topics Concern   Not on file  Social History Narrative   Not on file   Social Determinants of Health   Financial Resource Strain: Not on file  Food Insecurity: Not on file  Transportation Needs: Not on file  Physical Activity: Not on file  Stress: Not on file  Social Connections: Not on file  Intimate Partner Violence: Not on file    No Known Allergies  No current facility-administered medications on file prior to encounter.   Current Outpatient  Medications on File Prior to Encounter  Medication Sig Dispense Refill   acetaminophen (TYLENOL) 325 MG tablet Take 650 mg by mouth every 6 (six) hours as needed.     cetirizine (ZYRTEC ALLERGY) 10 MG tablet Take 1 tablet (10 mg total) by mouth daily. 30 tablet 0   ondansetron (ZOFRAN-ODT) 4 MG disintegrating tablet Take 1 tablet (4 mg total) by mouth every 8 (eight) hours as needed for nausea or vomiting. 20 tablet 0     Review of Systems  Constitutional:  Negative for chills and fever.  Eyes:  Negative for blurred vision and double vision.  Cardiovascular:  Negative for chest pain.  Neurological:  Negative for dizziness, tingling and sensory change.   Pertinent positives and negative per HPI, all others reviewed and negative  Physical Exam   BP 127/66 (BP Location: Right Arm)   Pulse 61   Temp 98.4 F (36.9 C) (Oral)   Resp 18   Ht 5\' 5"  (1.651 m)   Wt 88.6 kg   SpO2 100%   BMI 32.50 kg/m   No  data found.  Physical Exam Vitals reviewed.  Constitutional:      General: She is not in acute distress.    Appearance: She is well-developed. She is not toxic-appearing.  HENT:     Head: Normocephalic and atraumatic.     Mouth/Throat:     Mouth: Mucous membranes are moist.  Eyes:     General: No visual field deficit.    Extraocular Movements: Extraocular movements intact.  Cardiovascular:     Rate and Rhythm: Normal rate.  Pulmonary:     Effort: Pulmonary effort is normal. No respiratory distress.  Abdominal:     Palpations: Abdomen is soft.  Skin:    General: Skin is warm and dry.  Neurological:     Mental Status: She is alert and oriented to person, place, and time.     Cranial Nerves: No cranial nerve deficit or facial asymmetry.     Sensory: No sensory deficit.  Psychiatric:        Mood and Affect: Mood normal.        Behavior: Behavior normal.     Labs No results found for this or any previous visit (from the past 24 hour(s)).  Imaging No results  found.  MAU Course  Procedures  Lab Orders         Urinalysis, Routine w reflex microscopic    Meds ordered this encounter  Medications   DISCONTD: lactated ringers bolus 1,000 mL   DISCONTD: metoCLOPramide (REGLAN) injection 10 mg   DISCONTD: diphenhydrAMINE (BENADRYL) injection 25 mg   cyclobenzaprine (FLEXERIL) tablet 5 mg   DISCONTD: acetaminophen (TYLENOL) tablet 1,000 mg   cyclobenzaprine (FLEXERIL) 5 MG tablet    Sig: Take 1 tablet (5 mg total) by mouth 3 (three) times daily as needed for muscle spasms (headache).    Dispense:  30 tablet    Refill:  0    Imaging Orders         MR BRAIN WO CONTRAST     MDM moderate  Assessment and Plan  21 y.o. [redacted]w[redacted]d at [redacted]w[redacted]d with persistent h/ache, not improved despite meds at home. I ordered an MRI due to worsening symptoms, and no background history of migraines. MRI negative for vascular anomalies or a tumor.   I offered IV reglan and benadryl here in MAU, however she declined after 2 trials to get an IV access on her. She had on dose of flexeril and had some relief with it. Stable for discharge home at this time.  Recommend good hydration, rest, prn flexeril or tylenol. Avoid NSAIDs.   Dispo: discharged to home in stable condition.   Discharge Instructions     Discharge patient   Complete by: As directed    Discharge disposition: 01-Home or Self Care   Discharge patient date: 03/17/2022       Nadene Rubins Cilicia Borden, MD/MPH 03/20/22 9:18 PM  Allergies as of 03/17/2022   No Known Allergies      Medication List     TAKE these medications    acetaminophen 325 MG tablet Commonly known as: TYLENOL Take 650 mg by mouth every 6 (six) hours as needed.   cetirizine 10 MG tablet Commonly known as: ZyrTEC Allergy Take 1 tablet (10 mg total) by mouth daily.   cyclobenzaprine 5 MG tablet Commonly known as: FLEXERIL Take 1 tablet (5 mg total) by mouth 3 (three) times daily as needed for muscle spasms (headache).    ondansetron 4 MG disintegrating tablet Commonly known as: ZOFRAN-ODT Take 1 tablet (  4 mg total) by mouth every 8 (eight) hours as needed for nausea or vomiting.       Sheppard Evens MD MPH OB Fellow, Faculty Practice [facility] 03/20/2022

## 2022-03-27 DIAGNOSIS — Z315 Encounter for genetic counseling: Secondary | ICD-10-CM | POA: Diagnosis not present

## 2022-05-26 DIAGNOSIS — Z113 Encounter for screening for infections with a predominantly sexual mode of transmission: Secondary | ICD-10-CM | POA: Diagnosis not present

## 2022-05-26 DIAGNOSIS — Z8759 Personal history of other complications of pregnancy, childbirth and the puerperium: Secondary | ICD-10-CM | POA: Diagnosis not present

## 2022-05-26 DIAGNOSIS — E559 Vitamin D deficiency, unspecified: Secondary | ICD-10-CM | POA: Diagnosis not present

## 2022-05-26 DIAGNOSIS — Z6711 Type A blood, Rh negative: Secondary | ICD-10-CM | POA: Diagnosis not present

## 2022-05-26 DIAGNOSIS — F419 Anxiety disorder, unspecified: Secondary | ICD-10-CM | POA: Diagnosis not present

## 2022-05-26 DIAGNOSIS — Z331 Pregnant state, incidental: Secondary | ICD-10-CM | POA: Diagnosis not present

## 2022-05-26 DIAGNOSIS — Z3686 Encounter for antenatal screening for cervical length: Secondary | ICD-10-CM | POA: Diagnosis not present

## 2022-05-26 DIAGNOSIS — Z3A21 21 weeks gestation of pregnancy: Secondary | ICD-10-CM | POA: Diagnosis not present

## 2022-05-26 DIAGNOSIS — Z8659 Personal history of other mental and behavioral disorders: Secondary | ICD-10-CM | POA: Diagnosis not present

## 2022-05-26 DIAGNOSIS — Z363 Encounter for antenatal screening for malformations: Secondary | ICD-10-CM | POA: Diagnosis not present

## 2022-06-15 ENCOUNTER — Inpatient Hospital Stay (HOSPITAL_COMMUNITY)
Admission: AD | Admit: 2022-06-15 | Discharge: 2022-06-15 | Disposition: A | Discharge status: Home / Self Care | Payer: Medicaid Other | Attending: Obstetrics and Gynecology | Admitting: Obstetrics and Gynecology

## 2022-06-15 ENCOUNTER — Encounter (HOSPITAL_COMMUNITY): Payer: Self-pay | Admitting: Obstetrics and Gynecology

## 2022-06-15 DIAGNOSIS — Z3A24 24 weeks gestation of pregnancy: Secondary | ICD-10-CM | POA: Diagnosis not present

## 2022-06-15 DIAGNOSIS — R519 Headache, unspecified: Secondary | ICD-10-CM

## 2022-06-15 DIAGNOSIS — O26892 Other specified pregnancy related conditions, second trimester: Secondary | ICD-10-CM | POA: Diagnosis not present

## 2022-06-15 DIAGNOSIS — R109 Unspecified abdominal pain: Secondary | ICD-10-CM | POA: Diagnosis present

## 2022-06-15 DIAGNOSIS — Z3689 Encounter for other specified antenatal screening: Secondary | ICD-10-CM

## 2022-06-15 LAB — URINALYSIS, ROUTINE W REFLEX MICROSCOPIC
Bacteria, UA: NONE SEEN
Bilirubin Urine: NEGATIVE
Glucose, UA: NEGATIVE mg/dL
Hgb urine dipstick: NEGATIVE
Ketones, ur: NEGATIVE mg/dL
Nitrite: NEGATIVE
Protein, ur: 30 mg/dL — AB
Specific Gravity, Urine: 1.014 (ref 1.005–1.030)
pH: 7 (ref 5.0–8.0)

## 2022-06-15 MED ORDER — ACETAMINOPHEN-CAFFEINE 500-65 MG PO TABS
2.0000 | ORAL_TABLET | Freq: Once | ORAL | Status: AC
  Administered 2022-06-15: 2 via ORAL
  Filled 2022-06-15: qty 2

## 2022-06-15 MED ORDER — CYCLOBENZAPRINE HCL 5 MG PO TABS: 5.0000 mg | ORAL_TABLET | Freq: Three times a day (TID) | ORAL | 1 refills | Status: AC | PRN

## 2022-06-15 NOTE — MAU Note (Signed)
..  Sandra Roach is a 21 y.o. at [redacted]w[redacted]d here in MAU reporting: for 2-3 days she has noticed a fluctuation in her BP, unable to provide the exact numbers because her stepmom is the one who has been taking them and keeping track.  She started having headaches when her bp is high and abdominal cramping.  Denies vaginal bleeding or leaking of fluid.   Pain score: headache 0/10 now but goes up to 9/10;  cramping 8/10 Vitals:   06/15/22 2040  BP: 122/65  Pulse: 71  Resp: 17  Temp: 98 F (36.7 C)  SpO2: 99%     FHT:155 Lab orders placed from triage:  UA

## 2022-06-15 NOTE — MAU Provider Note (Signed)
  History     CSN: [id number]  Arrival date and time: 06/15/22 2018   Event Date/Time   First Provider Initiated Contact with Patient 06/15/22 2212      Chief Complaint  Patient presents with   Abdominal Pain   Headache   Hypertension   HPI Sandra Roach is a 21 y.o. G2P1001 at [redacted]w[redacted]d who presents to MAU {GYN/OB [id number]}  Past Medical History:  Diagnosis Date   Constipation     History reviewed. No pertinent surgical history.  Family History  Problem Relation Age of Onset   Cancer Mother     Social History   Tobacco Use   Smoking status: Never   Smokeless tobacco: Never  Vaping Use   Vaping Use: Never used  Substance Use Topics   Alcohol use: Yes   Drug use: Not Currently    Types: Marijuana    Allergies: No Known Allergies  Medications Prior to Admission  Medication Sig Dispense Refill Last Dose   acetaminophen (TYLENOL) 325 MG tablet Take 650 mg by mouth every 6 (six) hours as needed.   06/14/2022   cetirizine (ZYRTEC ALLERGY) 10 MG tablet Take 1 tablet (10 mg total) by mouth daily. 30 tablet 0    ondansetron (ZOFRAN-ODT) 4 MG disintegrating tablet Take 1 tablet (4 mg total) by mouth every 8 (eight) hours as needed for nausea or vomiting. 20 tablet 0    [DISCONTINUED] cyclobenzaprine (FLEXERIL) 5 MG tablet Take 1 tablet (5 mg total) by mouth 3 (three) times daily as needed for muscle spasms (headache). 30 tablet 0     Review of Systems Physical Exam   Blood pressure 129/63, pulse 79, temperature 98 F (36.7 C), temperature source Oral, resp. rate 17, height 5\' 5"  (1.651 m), weight 95.6 kg, SpO2 99 %, unknown if currently breastfeeding.  Physical Exam  MAU Course  Procedures  MDM ***  Assessment and Plan  ***  06/15/2022, 11:10 PM

## 2022-06-23 DIAGNOSIS — Z363 Encounter for antenatal screening for malformations: Secondary | ICD-10-CM | POA: Diagnosis not present

## 2022-06-23 DIAGNOSIS — Z3A25 25 weeks gestation of pregnancy: Secondary | ICD-10-CM | POA: Diagnosis not present

## 2022-07-14 NOTE — L&D Delivery Note (Signed)
Operative Delivery Note At 2:02 PM a viable female was delivered via Vaginal, Spontaneous.  Presentation:  LOA ; Position: Left,, Occiput,, Anterior; Station: +3.  Verbal consent: obtained from patient.  Risks and benefits discussed in detail.  Risks include, but are not limited to the risks of anesthesia, bleeding, infection, damage to maternal tissues, fetal cephalhematoma.  There is also the risk of inability to effect vaginal delivery of the head, or shoulder dystocia that cannot be resolved by established maneuvers, leading to the need for emergency cesarean section.  Fetal heart tones to the 70s with intermittent accels.  KIWI placed first and the handle pulled off.  A second vacuum was applied.  One pull in the greeen zone and baby delivered without difficulty.  No pop offs.  Arterial PH sent  APGAR: , ; weight  .   Placenta status: , .   Cord:  with the following complications: .  Cord pH: sent  Anesthesia:  epidural Instruments: vacuum Episiotomy: None Lacerations: None Suture Repair:  na Est. Blood Loss (mL):   200 cc Mom to  [facility] specialty care .  Baby to Couplet care / Skin to Skin.  Zimir Kittleson A Brigida Scotti 09/17/2022, 2:33 PM

## 2022-07-16 ENCOUNTER — Inpatient Hospital Stay (HOSPITAL_COMMUNITY)
Admission: AD | Admit: 2022-07-16 | Discharge: 2022-07-17 | Disposition: A | Discharge status: Home / Self Care | Payer: Medicaid Other | Attending: Obstetrics and Gynecology | Admitting: Obstetrics and Gynecology

## 2022-07-16 DIAGNOSIS — O4700 False labor before 37 completed weeks of gestation, unspecified trimester: Secondary | ICD-10-CM

## 2022-07-16 DIAGNOSIS — M549 Dorsalgia, unspecified: Secondary | ICD-10-CM

## 2022-07-16 DIAGNOSIS — O4690 Antepartum hemorrhage, unspecified, unspecified trimester: Secondary | ICD-10-CM

## 2022-07-16 DIAGNOSIS — M545 Low back pain, unspecified: Secondary | ICD-10-CM | POA: Insufficient documentation

## 2022-07-16 DIAGNOSIS — N858 Other specified noninflammatory disorders of uterus: Secondary | ICD-10-CM

## 2022-07-16 DIAGNOSIS — Z3A29 29 weeks gestation of pregnancy: Secondary | ICD-10-CM | POA: Insufficient documentation

## 2022-07-16 DIAGNOSIS — O99891 Other specified diseases and conditions complicating pregnancy: Secondary | ICD-10-CM | POA: Insufficient documentation

## 2022-07-16 DIAGNOSIS — O26893 Other specified pregnancy related conditions, third trimester: Secondary | ICD-10-CM | POA: Insufficient documentation

## 2022-07-16 DIAGNOSIS — O4693 Antepartum hemorrhage, unspecified, third trimester: Secondary | ICD-10-CM | POA: Insufficient documentation

## 2022-07-16 NOTE — MAU Note (Signed)
.  Sandra Roach is a 21 y.o. at [redacted]w[redacted]d here in MAU reporting: small amounts VB dark red with clotting since today and continued. Pt wearing pad. Pt reporting lower ABD cramping intermittently and radiating to back and mid ABD with tightening that started yesterday, making it difficult to walk. Pt report feeling baby move just feels different. Pt reports increase frequency urination with nothing coming out. Pt states she has occasional hot flashes and lightheadness. Pt denies LOF, abnormal discharge, PIH s/s, and complications in the pregnancy. Pt denies exposure to illness.  Last intercourse Monday   Onset of complaint: yesterday Pain score: 7/10 Vitals:   07/16/22 2354  BP: 129/74  Pulse: 100  Resp: 18  Temp: 99 F (37.2 C)  SpO2: 99%     FHT:145 Lab orders placed from triage:  UA

## 2022-07-17 ENCOUNTER — Encounter (HOSPITAL_COMMUNITY): Payer: Self-pay | Admitting: Obstetrics and Gynecology

## 2022-07-17 DIAGNOSIS — O99891 Other specified diseases and conditions complicating pregnancy: Secondary | ICD-10-CM

## 2022-07-17 DIAGNOSIS — O26893 Other specified pregnancy related conditions, third trimester: Secondary | ICD-10-CM | POA: Diagnosis not present

## 2022-07-17 DIAGNOSIS — O4693 Antepartum hemorrhage, unspecified, third trimester: Secondary | ICD-10-CM | POA: Diagnosis not present

## 2022-07-17 DIAGNOSIS — Z3A29 29 weeks gestation of pregnancy: Secondary | ICD-10-CM

## 2022-07-17 DIAGNOSIS — M549 Dorsalgia, unspecified: Secondary | ICD-10-CM | POA: Diagnosis not present

## 2022-07-17 DIAGNOSIS — O4703 False labor before 37 completed weeks of gestation, third trimester: Secondary | ICD-10-CM | POA: Diagnosis not present

## 2022-07-17 DIAGNOSIS — M545 Low back pain, unspecified: Secondary | ICD-10-CM | POA: Diagnosis not present

## 2022-07-17 DIAGNOSIS — N858 Other specified noninflammatory disorders of uterus: Secondary | ICD-10-CM | POA: Diagnosis not present

## 2022-07-17 LAB — COMPREHENSIVE METABOLIC PANEL
ALT: 10 U/L (ref 0–44)
AST: 17 U/L (ref 15–41)
Albumin: 2.8 g/dL — ABNORMAL LOW (ref 3.5–5.0)
Alkaline Phosphatase: 96 U/L (ref 38–126)
Anion gap: 11 (ref 5–15)
BUN: <5 mg/dL — ABNORMAL LOW (ref 6–20)
CO2: 23 mmol/L (ref 22–32)
Calcium: 8.5 mg/dL — ABNORMAL LOW (ref 8.9–10.3)
Chloride: 101 mmol/L (ref 98–111)
Creatinine, Ser: 0.58 mg/dL (ref 0.44–1.00)
GFR, Estimated: >60 mL/min (ref >60)
Glucose, Bld: 90 mg/dL (ref 70–99)
Potassium: 3.2 mmol/L — ABNORMAL LOW (ref 3.5–5.1)
Sodium: 135 mmol/L (ref 135–145)
Total Bilirubin: 0.5 mg/dL (ref 0.3–1.2)
Total Protein: 6.5 g/dL (ref 6.5–8.1)

## 2022-07-17 LAB — URINALYSIS, ROUTINE W REFLEX MICROSCOPIC
Glucose, UA: NEGATIVE mg/dL
Hgb urine dipstick: NEGATIVE
Ketones, ur: NEGATIVE mg/dL
Nitrite: NEGATIVE
Protein, ur: 100 mg/dL — AB
Specific Gravity, Urine: 1.027 (ref 1.005–1.030)
pH: 7 (ref 5.0–8.0)

## 2022-07-17 LAB — CBC
HCT: 34.9 % — ABNORMAL LOW (ref 36.0–46.0)
Hemoglobin: 11.8 g/dL — ABNORMAL LOW (ref 12.0–15.0)
MCH: 29.4 pg (ref 26.0–34.0)
MCHC: 33.8 g/dL (ref 30.0–36.0)
MCV: 87 fL (ref 80.0–100.0)
Platelets: 238 10*3/uL (ref 150–400)
RBC: 4.01 MIL/uL (ref 3.87–5.11)
RDW: 12.1 % (ref 11.5–15.5)
WBC: 7.7 10*3/uL (ref 4.0–10.5)
nRBC: 0 % (ref 0.0–0.2)

## 2022-07-17 LAB — ABO/RH: ABO/RH(D): A NEG

## 2022-07-17 MED ORDER — RHO D IMMUNE GLOBULIN 1500 UNIT/2ML IJ SOSY
300.0000 ug | PREFILLED_SYRINGE | Freq: Once | INTRAMUSCULAR | Status: AC
  Administered 2022-07-17: 300 ug via INTRAMUSCULAR
  Filled 2022-07-17: qty 2

## 2022-07-17 MED ORDER — NIFEDIPINE 10 MG PO CAPS
10.0000 mg | ORAL_CAPSULE | ORAL | Status: DC | PRN
  Administered 2022-07-17 (×3): 10 mg via ORAL
  Filled 2022-07-17 (×3): qty 1

## 2022-07-17 MED ORDER — OXYCODONE-ACETAMINOPHEN 5-325 MG PO TABS
1.0000 | ORAL_TABLET | Freq: Once | ORAL | Status: AC
  Administered 2022-07-17: 1 via ORAL
  Filled 2022-07-17: qty 1

## 2022-07-17 MED ORDER — HYOSCYAMINE SULFATE 0.125 MG SL SUBL
0.1250 mg | SUBLINGUAL_TABLET | Freq: Once | SUBLINGUAL | Status: AC
  Administered 2022-07-17: 0.125 mg via SUBLINGUAL
  Filled 2022-07-17: qty 1

## 2022-07-17 NOTE — MAU Provider Note (Signed)
Chief Complaint:  Abdominal Pain and Vaginal Bleeding   Event Date/Time   First Provider Initiated Contact with Patient 07/17/22 0023     HPI: Sandra Roach is a 22 y.o. G2P1001 at 40w2dwho presents to maternity admissions reporting vaginal bleeding with clots, abdominal pain which radiates to back.   Did not call her provider.  Pain has been present since yesterday and bleeding happened today. . She reports good fetal movement, denies LOF, urinary symptoms, h/a, dizziness, n/v, diarrhea, constipation or fever/chills.    Abdominal Pain This is a new problem. The current episode started yesterday. The onset quality is gradual. The problem occurs intermittently. The quality of the pain is described as cramping. The pain radiates to the back. Pertinent negatives include no constipation, diarrhea, dysuria, fever or frequency. Nothing relieves the symptoms. Past treatments include nothing.  Vaginal Bleeding The patient's primary symptoms include pelvic pain and vaginal bleeding. The patient's pertinent negatives include no genital itching or genital odor. This is a new problem. The current episode started today. The problem has been resolved. Associated symptoms include abdominal pain and back pain. Pertinent negatives include no constipation, diarrhea, dysuria, fever or frequency. The vaginal discharge was bloody. The vaginal bleeding is lighter than menses. She has been passing clots. She has not been passing tissue. Nothing aggravates the symptoms. She has tried nothing for the symptoms.   RN Note: Sandra Roach is a 22 y.o. at [redacted]w[redacted]d here in MAU reporting: small amounts VB dark red with clotting since today and continued. Pt wearing pad. Pt reporting lower ABD cramping intermittently and radiating to back and mid ABD with tightening that started yesterday, making it difficult to walk. Pt report feeling baby move just feels different. Pt reports increase frequency urination with nothing coming out. Pt  states she has occasional hot flashes and lightheadness. Pt denies LOF, abnormal discharge, PIH s/s, and complications in the pregnancy. Pt denies exposure to illness.  Last intercourse Monday    Onset of complaint: yesterday Pain score: 7/10  Past Medical History: Past Medical History:  Diagnosis Date   Constipation     Past obstetric history: OB History  Gravida Para Term Preterm AB Living  2 1 1     1   SAB IAB Ectopic Multiple Live Births          1    # Outcome Date GA Lbr Len/2nd Weight Sex Delivery Anes PTL Lv  2 Current           1 Term 2018     Vag-Spont   LIV    Past Surgical History: History reviewed. No pertinent surgical history.  Family History: Family History  Problem Relation Age of Onset   Cancer Mother     Social History: Social History   Tobacco Use   Smoking status: Never   Smokeless tobacco: Never  Vaping Use   Vaping Use: Never used  Substance Use Topics   Alcohol use: Not Currently   Drug use: Not Currently    Types: Marijuana    Allergies: No Known Allergies  Meds:  Medications Prior to Admission  Medication Sig Dispense Refill Last Dose   acetaminophen (TYLENOL) 325 MG tablet Take 650 mg by mouth every 6 (six) hours as needed.      cetirizine (ZYRTEC ALLERGY) 10 MG tablet Take 1 tablet (10 mg total) by mouth daily. 30 tablet 0    cyclobenzaprine (FLEXERIL) 5 MG tablet Take 1 tablet (5 mg total) by mouth 3 (three) times daily  as needed for muscle spasms (headache). 30 tablet 1    ondansetron (ZOFRAN-ODT) 4 MG disintegrating tablet Take 1 tablet (4 mg total) by mouth every 8 (eight) hours as needed for nausea or vomiting. 20 tablet 0     I have reviewed patient's Past Medical Hx, Surgical Hx, Family Hx, Social Hx, medications and allergies.   ROS:  Review of Systems  Constitutional:  Negative for fever.  Gastrointestinal:  Positive for abdominal pain. Negative for constipation and diarrhea.  Genitourinary:  Positive for pelvic pain  and vaginal bleeding. Negative for dysuria and frequency.  Musculoskeletal:  Positive for back pain.   Other systems negative  Physical Exam  Patient Vitals for the past 24 hrs:  BP Temp Temp src Pulse Resp SpO2 Height Weight  07/17/22 0015 123/69 -- -- (!) 105 -- 99 % -- --  07/16/22 2354 129/74 99 F (37.2 C) Oral 100 18 99 % 5\' 5"  (1.651 m) 94.8 kg   Constitutional: Well-developed, well-nourished female in no acute distress.  Cardiovascular: normal rate  Respiratory: normal effort GI: Abd soft, non-tender, gravid appropriate for gestational age.   No rebound or guarding. MS: Extremities nontender, no edema, normal ROM Neurologic: Alert and oriented x 4.  GU: Neg CVAT.  PELVIC EXAM: Cervix pink, visually closed, without lesion, scant white creamy discharge, vaginal walls and external genitalia normal No color to discharge, no blood seen in vagina or on speculum   FHT:  Baseline 145 , moderate variability, accelerations present, no decelerations Contractions: Irregular with irritability   Labs: Results for orders placed or performed during the hospital encounter of 07/16/22 (from the past 24 hour(s))  Urinalysis, Routine w reflex microscopic     Status: Abnormal   Collection Time: 07/17/22 12:42 AM  Result Value Ref Range   Color, Urine AMBER (A) YELLOW   APPearance CLEAR CLEAR   Specific Gravity, Urine 1.027 1.005 - 1.030   pH 7.0 5.0 - 8.0   Glucose, UA NEGATIVE NEGATIVE mg/dL   Hgb urine dipstick NEGATIVE NEGATIVE   Bilirubin Urine SMALL (A) NEGATIVE   Ketones, ur NEGATIVE NEGATIVE mg/dL   Protein, ur 100 (A) NEGATIVE mg/dL   Nitrite NEGATIVE NEGATIVE   Leukocytes,Ua SMALL (A) NEGATIVE   RBC / HPF 0-5 0 - 5 RBC/hpf   WBC, UA 11-20 0 - 5 WBC/hpf   Bacteria, UA RARE (A) NONE SEEN   Squamous Epithelial / HPF 6-10 0 - 5 /HPF   Mucus PRESENT   Comprehensive metabolic panel     Status: Abnormal   Collection Time: 07/17/22 12:42 AM  Result Value Ref Range   Sodium 135  135 - 145 mmol/L   Potassium 3.2 (L) 3.5 - 5.1 mmol/L   Chloride 101 98 - 111 mmol/L   CO2 23 22 - 32 mmol/L   Glucose, Bld 90 70 - 99 mg/dL   BUN <5 (L) 6 - 20 mg/dL   Creatinine, Ser 0.58 0.44 - 1.00 mg/dL   Calcium 8.5 (L) 8.9 - 10.3 mg/dL   Total Protein 6.5 6.5 - 8.1 g/dL   Albumin 2.8 (L) 3.5 - 5.0 g/dL   AST 17 15 - 41 U/L   ALT 10 0 - 44 U/L   Alkaline Phosphatase 96 38 - 126 U/L   Total Bilirubin 0.5 0.3 - 1.2 mg/dL   GFR, Estimated >60 >60 mL/min   Anion gap 11 5 - 15  ABO/Rh     Status: None   Collection Time: 07/17/22  1:08 AM  Result Value Ref Range   ABO/RH(D) A NEG   CBC     Status: Abnormal   Collection Time: 07/17/22  1:10 AM  Result Value Ref Range   WBC 7.7 4.0 - 10.5 K/uL   RBC 4.01 3.87 - 5.11 MIL/uL   Hemoglobin 11.8 (L) 12.0 - 15.0 g/dL   HCT 34.9 (L) 36.0 - 46.0 %   MCV 87.0 80.0 - 100.0 fL   MCH 29.4 26.0 - 34.0 pg   MCHC 33.8 30.0 - 36.0 g/dL   RDW 12.1 11.5 - 15.5 %   Platelets 238 150 - 400 K/uL   nRBC 0.0 0.0 - 0.2 %  Rh IG workup (includes ABO/Rh)     Status: None (Preliminary result)   Collection Time: 07/17/22  2:22 AM  Result Value Ref Range   Gestational Age(Wks) 29    ABO/RH(D) A NEG    Antibody Screen NEG    Unit Number [id number]    Blood Component Type RHIG    Unit division 00    Status of Unit ISSUED    Transfusion Status      OK TO TRANSFUSE Performed at [facility], [address] [place], [place] [address]        Imaging:  No results found.  MAU Course/MDM: I have reviewed the triage vital signs and the nursing notes.   Pertinent labs & imaging results that were available during my care of the patient were reviewed by me and considered in my medical decision making (see chart for details).      I have reviewed her medical records including past results, notes and treatments.   I have ordered labs and reviewed results.  NST reviewed, reactive   Treatments in MAU included Procardia series x 3 doses, did  not help much.  Levsin given and it did help her cramping (?some of cramping intestinal).  One Percocet given which did help back pain.   Rhophylac given due to report of bleeding at home, even though no evidence of bleeding was seen on exam..    Assessment: Single IUP at [redacted]w[redacted]d  Vaginal bleeding at home, no bleeding here Preterm uterine irritability with closed cervix Low back pain  Plan: Discharge home Preterm Labor precautions and fetal kick counts Has Flexeril at home for back pain Follow up in Office for prenatal visits and recheck Encouraged to return if she develops worsening of symptoms, increase in pain, fever, or other concerning symptoms.   Pt stable at time of discharge.  Hansel Feinstein CNM, MSN Certified Nurse-Midwife 07/17/2022 12:23 AM

## 2022-07-18 ENCOUNTER — Telehealth: Payer: Self-pay | Admitting: Certified Nurse Midwife

## 2022-07-18 LAB — CULTURE, OB URINE

## 2022-07-18 NOTE — Telephone Encounter (Signed)
[facility] called to report that rhogam workup on 07/17/22 did not include KB test which would determine if the pt needs an additional dose of rhogam to cover her bleeding event. Reviewed the chart with Dr. Kennon Rounds who agreed that since no bleeding was witnessed on our exam, the first dose is enough to cover her and she will have antibody testing at delivery - KB is not necessary in this case.  Gaylan Gerold, CNM, MSN, [place] Certified Nurse Midwife, [facility]

## 2022-07-21 ENCOUNTER — Inpatient Hospital Stay (HOSPITAL_COMMUNITY)
Admission: AD | Admit: 2022-07-21 | Discharge: 2022-07-21 | Disposition: A | Discharge status: Home / Self Care | Payer: Medicaid Other | Attending: Obstetrics & Gynecology | Admitting: Obstetrics & Gynecology

## 2022-07-21 ENCOUNTER — Encounter (HOSPITAL_COMMUNITY): Payer: Self-pay | Admitting: Obstetrics & Gynecology

## 2022-07-21 DIAGNOSIS — O0990 Supervision of high risk pregnancy, unspecified, unspecified trimester: Secondary | ICD-10-CM | POA: Diagnosis not present

## 2022-07-21 DIAGNOSIS — R1084 Generalized abdominal pain: Secondary | ICD-10-CM | POA: Diagnosis not present

## 2022-07-21 DIAGNOSIS — O4703 False labor before 37 completed weeks of gestation, third trimester: Secondary | ICD-10-CM | POA: Insufficient documentation

## 2022-07-21 DIAGNOSIS — Z3689 Encounter for other specified antenatal screening: Secondary | ICD-10-CM | POA: Diagnosis not present

## 2022-07-21 DIAGNOSIS — M549 Dorsalgia, unspecified: Secondary | ICD-10-CM | POA: Insufficient documentation

## 2022-07-21 DIAGNOSIS — O26893 Other specified pregnancy related conditions, third trimester: Secondary | ICD-10-CM | POA: Diagnosis present

## 2022-07-21 DIAGNOSIS — Z3A29 29 weeks gestation of pregnancy: Secondary | ICD-10-CM | POA: Diagnosis not present

## 2022-07-21 DIAGNOSIS — R12 Heartburn: Secondary | ICD-10-CM | POA: Insufficient documentation

## 2022-07-21 DIAGNOSIS — O479 False labor, unspecified: Secondary | ICD-10-CM | POA: Diagnosis not present

## 2022-07-21 LAB — URINALYSIS, ROUTINE W REFLEX MICROSCOPIC
Bilirubin Urine: NEGATIVE
Glucose, UA: NEGATIVE mg/dL
Hgb urine dipstick: NEGATIVE
Ketones, ur: NEGATIVE mg/dL
Leukocytes,Ua: NEGATIVE
Nitrite: NEGATIVE
Protein, ur: 30 mg/dL — AB
Specific Gravity, Urine: 1.027 (ref 1.005–1.030)
pH: 6 (ref 5.0–8.0)

## 2022-07-21 LAB — RH IG WORKUP (INCLUDES ABO/RH)
ABO/RH(D): A NEG
Antibody Screen: NEGATIVE
Gestational Age(Wks): 29
Unit division: 0

## 2022-07-21 LAB — WET PREP, GENITAL
Clue Cells Wet Prep HPF POC: NONE SEEN
Sperm: NONE SEEN
Trich, Wet Prep: NONE SEEN
WBC, Wet Prep HPF POC: <10 (ref <10)
Yeast Wet Prep HPF POC: NONE SEEN

## 2022-07-21 MED ORDER — NIFEDIPINE 10 MG PO CAPS
10.0000 mg | ORAL_CAPSULE | ORAL | Status: AC | PRN
  Administered 2022-07-21 (×3): 10 mg via ORAL
  Filled 2022-07-21 (×3): qty 1

## 2022-07-21 MED ORDER — LACTATED RINGERS IV BOLUS
1000.0000 mL | Freq: Once | INTRAVENOUS | Status: AC
  Administered 2022-07-21: 1000 mL via INTRAVENOUS

## 2022-07-21 MED ORDER — MORPHINE SULFATE (PF) 4 MG/ML IV SOLN
4.0000 mg | Freq: Once | INTRAVENOUS | Status: AC
  Administered 2022-07-21: 4 mg via INTRAVENOUS
  Filled 2022-07-21: qty 1

## 2022-07-21 MED ORDER — FAMOTIDINE IN NACL 20-0.9 MG/50ML-% IV SOLN
20.0000 mg | Freq: Once | INTRAVENOUS | Status: AC
  Administered 2022-07-21: 20 mg via INTRAVENOUS
  Filled 2022-07-21: qty 50

## 2022-07-21 NOTE — MAU Note (Signed)
...  Sandra Roach is a 22 y.o. at [redacted]w[redacted]d here in MAU reporting: Patient arrived via EMS from [facility] for evaluation of contractions. She reports her cervix was closed in the office. She reports she has been contracting since she was evaluated in MAU on 1/3 but reports her pains are worsening and are closer together. She reports she has also been leaking a clear fluid every time she has the urge to urinate. She is unable to state how often her contractions are. She reports she would like a second opinion on her CTX. Denies VB or LOF. +FM.   Onset of complaint: Ongoing since 1/3 Pain score:  10/10 lower back 10/10 lower abdomen  FHT: 155 initial external Lab orders placed from triage: UA

## 2022-07-21 NOTE — MAU Provider Note (Signed)
History     CSN: [id number]  Arrival date and time: 07/21/22 1713   Event Date/Time   First Provider Initiated Contact with Patient 07/21/22 1748      Chief Complaint  Patient presents with   Back Pain   Contractions   Rupture of Membranes   HPI Sandra Roach is a 22 y.o. G2P1001 at [redacted]w[redacted]d who receives care at [facility].  She was was being seen today and reported contractions.  She was informed that she her cervix was closed, but was transferred, via EMS.  She continues to report contractions as well as back pain and leaking of leaking. She reports her contractions have been ongoing since "my last doctor appt, but it got worse."  She states the contractions worsened about one week ago. She states she has been taking flexeril with temporary relief. She states the contractions are located in her back and lower stomach. She also reports some epigastric burning. Patient endorses fetal movement and denies vaginal bleeding.  However, she is unsure if she is leaking fluid or having vaginal discharge. She reports having multiple incidents were her underwear were saturated. She denies vaginal odor or irritation.  She endorses sexual activity in the past 3 days.  OB History     Gravida  2   Para  1   Term  1   Preterm      AB      Living  1      SAB      IAB      Ectopic      Multiple      Live Births  1           Past Medical History:  Diagnosis Date   Constipation     History reviewed. No pertinent surgical history.  Family History  Problem Relation Age of Onset   Cancer Mother     Social History   Tobacco Use   Smoking status: Never   Smokeless tobacco: Never  Vaping Use   Vaping Use: Never used  Substance Use Topics   Alcohol use: Not Currently   Drug use: Not Currently    Types: Marijuana    Allergies: No Known Allergies  Medications Prior to Admission  Medication Sig Dispense Refill Last Dose   cyclobenzaprine (FLEXERIL) 5 MG tablet Take 1 tablet  (5 mg total) by mouth 3 (three) times daily as needed for muscle spasms (headache). 30 tablet 1 07/21/2022 at 1300   acetaminophen (TYLENOL) 325 MG tablet Take 650 mg by mouth every 6 (six) hours as needed.      cetirizine (ZYRTEC ALLERGY) 10 MG tablet Take 1 tablet (10 mg total) by mouth daily. 30 tablet 0    ondansetron (ZOFRAN-ODT) 4 MG disintegrating tablet Take 1 tablet (4 mg total) by mouth every 8 (eight) hours as needed for nausea or vomiting. 20 tablet 0     Review of Systems  Gastrointestinal:  Negative for nausea and vomiting.  Genitourinary:  Negative for difficulty urinating and dysuria.   Physical Exam   Blood pressure 121/62, pulse 79, resp. rate 17, height 5\' 5"  (1.651 m), weight 98.9 kg, SpO2 99 %, unknown if currently breastfeeding.  Physical Exam Vitals reviewed. Exam conducted with a chaperone present.  Constitutional:      Appearance: Normal appearance.  HENT:     Head: Normocephalic and atraumatic.  Eyes:     Conjunctiva/sclera: Conjunctivae normal.  Cardiovascular:     Rate and Rhythm: Normal rate.  Pulmonary:  Effort: Pulmonary effort is normal. No respiratory distress.  Abdominal:     General: Bowel sounds are normal. There is no distension.     Tenderness: There is abdominal tenderness.     Comments: Gravid, Ctx palpated in lower abdominal area.   Genitourinary:    Comments: Sterile Speculum Exam: -Normal External Genitalia: Non tender, no apparent discharge at introitus.  -Vaginal Vault: Pink mucosa with good rugae. No pooling. Negative valsalva. -Cervix:Pink, no lesions, cysts, or polyps.  Appears closed. No active bleeding or leaking from os. -GC/CT and Wet Prep collected blindly -Bimanual Exam: Dilation: Fingertip Effacement (%): Thick Cervical Position: Anterior Station: Ballotable Presentation: Undeterminable Exam by:: Sabas Sous, CNM Musculoskeletal:        General: Normal range of motion.     Cervical back: Normal range of motion.  Skin:     General: Skin is warm and dry.  Neurological:     Mental Status: She is alert and oriented to person, place, and time.  Psychiatric:        Mood and Affect: Mood normal.        Behavior: Behavior normal.     Fetal Assessment 140 bpm, Mod Var, -Decels, +Accels Toco: Ctx palpated Q2-73min  MAU Course   Results for orders placed or performed during the hospital encounter of 07/21/22 (from the past 24 hour(s))  Urinalysis, Routine w reflex microscopic Urine, Clean Catch     Status: Abnormal   Collection Time: 07/21/22  5:35 PM  Result Value Ref Range   Color, Urine YELLOW YELLOW   APPearance HAZY (A) CLEAR   Specific Gravity, Urine 1.027 1.005 - 1.030   pH 6.0 5.0 - 8.0   Glucose, UA NEGATIVE NEGATIVE mg/dL   Hgb urine dipstick NEGATIVE NEGATIVE   Bilirubin Urine NEGATIVE NEGATIVE   Ketones, ur NEGATIVE NEGATIVE mg/dL   Protein, ur 30 (A) NEGATIVE mg/dL   Nitrite NEGATIVE NEGATIVE   Leukocytes,Ua NEGATIVE NEGATIVE   RBC / HPF 0-5 0 - 5 RBC/hpf   WBC, UA 6-10 0 - 5 WBC/hpf   Bacteria, UA RARE (A) NONE SEEN   Squamous Epithelial / HPF 6-10 0 - 5 /HPF   Mucus PRESENT   Wet prep, genital     Status: None   Collection Time: 07/21/22  5:48 PM   Specimen: Vaginal  Result Value Ref Range   Yeast Wet Prep HPF POC NONE SEEN NONE SEEN   Trich, Wet Prep NONE SEEN NONE SEEN   Clue Cells Wet Prep HPF POC NONE SEEN NONE SEEN   WBC, Wet Prep HPF POC <10 <10   Sperm NONE SEEN    No results found.  MDM PE Labs: UA EFM Cultures: [name] Prep & GC/CT IV Fluids Pain Medication  Procardia series PPI Assessment and Plan  22 year old G2P1001  SIUP at 29.6 weeks Cat I FT Preterm Contractions R/O ROM Heartburn  -POC Reviewed.  -Exam performed and findings discussed. -Start IV and give LR bolus. Also give Pepcid for c/o heartburn.  -Patient offered and accepts pain medication. -IV morphine ordered 4mg  -NST reactive for GA. -Monitor and reassess.  -Cultures collected, by  nurse, blindly. -Fern negative  MSN, CNM 07/21/2022, 5:55 PM   Reassessment (7:49 PM)  -Patient reports some improvement in pain with morphine dosing. Now 7/10. -Contractions continue and at same frequency. -Will start procardia per protocol.  -Further discussed possibility of terbutaline -Patient without questions or concerns.   Reassessment (8:31 PM) -Patient reports pain  without improvement despite 2 doses procardia. -Cervical exam performed and no change. -Reassures given and patient agreeable to additional procardia dosing. -Cat I FT, okay to discontinue EFM, but leave tocometry in place.   Reassessment (10:36 PM) -Patient reports improvement after procardia series. -Cautioned that contractions and back pain may return. -Discussed home management and precautions given. -Patient to follow up as scheduled. -Encouraged to call primary office or return to MAU if symptoms worsen or with the onset of new symptoms. -Discharged to home in improved condition.  Maryann Conners MSN, CNM Advanced Practice Provider, [facility]

## 2022-07-22 LAB — GC/CHLAMYDIA PROBE AMP (~~LOC~~) NOT AT ARMC
Chlamydia: NEGATIVE
Comment: NEGATIVE
Comment: NORMAL
Neisseria Gonorrhea: NEGATIVE

## 2022-08-07 LAB — OB RESULTS CONSOLE HIV ANTIBODY (ROUTINE TESTING): HIV: NONREACTIVE

## 2022-08-09 ENCOUNTER — Inpatient Hospital Stay (HOSPITAL_COMMUNITY): Payer: Medicaid Other

## 2022-08-09 ENCOUNTER — Encounter (HOSPITAL_COMMUNITY): Payer: Self-pay | Admitting: Obstetrics and Gynecology

## 2022-08-09 ENCOUNTER — Inpatient Hospital Stay (HOSPITAL_COMMUNITY)
Admission: AD | Admit: 2022-08-09 | Discharge: 2022-08-10 | Disposition: A | Discharge status: Home / Self Care | Payer: Medicaid Other | Attending: Obstetrics and Gynecology | Admitting: Obstetrics and Gynecology

## 2022-08-09 DIAGNOSIS — Z1152 Encounter for screening for COVID-19: Secondary | ICD-10-CM | POA: Diagnosis not present

## 2022-08-09 DIAGNOSIS — Z3A32 32 weeks gestation of pregnancy: Secondary | ICD-10-CM

## 2022-08-09 DIAGNOSIS — R6889 Other general symptoms and signs: Secondary | ICD-10-CM

## 2022-08-09 DIAGNOSIS — O99891 Other specified diseases and conditions complicating pregnancy: Secondary | ICD-10-CM | POA: Insufficient documentation

## 2022-08-09 DIAGNOSIS — O26893 Other specified pregnancy related conditions, third trimester: Secondary | ICD-10-CM | POA: Insufficient documentation

## 2022-08-09 DIAGNOSIS — O99323 Drug use complicating pregnancy, third trimester: Secondary | ICD-10-CM | POA: Insufficient documentation

## 2022-08-09 DIAGNOSIS — R0602 Shortness of breath: Secondary | ICD-10-CM

## 2022-08-09 DIAGNOSIS — O4703 False labor before 37 completed weeks of gestation, third trimester: Secondary | ICD-10-CM

## 2022-08-09 LAB — RESP PANEL BY RT-PCR (RSV, FLU A&B, COVID)  RVPGX2
Influenza A by PCR: NEGATIVE
Influenza B by PCR: NEGATIVE
Resp Syncytial Virus by PCR: NEGATIVE
SARS Coronavirus 2 by RT PCR: NEGATIVE

## 2022-08-09 LAB — COMPREHENSIVE METABOLIC PANEL
ALT: 15 U/L (ref 0–44)
AST: 31 U/L (ref 15–41)
Albumin: 2.6 g/dL — ABNORMAL LOW (ref 3.5–5.0)
Alkaline Phosphatase: 123 U/L (ref 38–126)
Anion gap: 11 (ref 5–15)
BUN: <5 mg/dL — ABNORMAL LOW (ref 6–20)
CO2: 18 mmol/L — ABNORMAL LOW (ref 22–32)
Calcium: 8.5 mg/dL — ABNORMAL LOW (ref 8.9–10.3)
Chloride: 104 mmol/L (ref 98–111)
Creatinine, Ser: 0.61 mg/dL (ref 0.44–1.00)
GFR, Estimated: >60 mL/min (ref >60)
Glucose, Bld: 108 mg/dL — ABNORMAL HIGH (ref 70–99)
Potassium: 3.1 mmol/L — ABNORMAL LOW (ref 3.5–5.1)
Sodium: 133 mmol/L — ABNORMAL LOW (ref 135–145)
Total Bilirubin: 0.6 mg/dL (ref 0.3–1.2)
Total Protein: 6.3 g/dL — ABNORMAL LOW (ref 6.5–8.1)

## 2022-08-09 LAB — CBC WITH DIFFERENTIAL/PLATELET
Abs Immature Granulocytes: 0.15 10*3/uL — ABNORMAL HIGH (ref 0.00–0.07)
Basophils Absolute: 0 10*3/uL (ref 0.0–0.1)
Basophils Relative: 0 %
Eosinophils Absolute: 0.1 10*3/uL (ref 0.0–0.5)
Eosinophils Relative: 2 %
HCT: 34.1 % — ABNORMAL LOW (ref 36.0–46.0)
Hemoglobin: 11.5 g/dL — ABNORMAL LOW (ref 12.0–15.0)
Immature Granulocytes: 2 %
Lymphocytes Relative: 12 %
Lymphs Abs: 0.9 10*3/uL (ref 0.7–4.0)
MCH: 28.9 pg (ref 26.0–34.0)
MCHC: 33.7 g/dL (ref 30.0–36.0)
MCV: 85.7 fL (ref 80.0–100.0)
Monocytes Absolute: 0.6 10*3/uL (ref 0.1–1.0)
Monocytes Relative: 8 %
Neutro Abs: 5.6 10*3/uL (ref 1.7–7.7)
Neutrophils Relative %: 76 %
Platelets: 175 10*3/uL (ref 150–400)
RBC: 3.98 MIL/uL (ref 3.87–5.11)
RDW: 12.6 % (ref 11.5–15.5)
WBC: 7.4 10*3/uL (ref 4.0–10.5)
nRBC: 0 % (ref 0.0–0.2)

## 2022-08-09 LAB — URINALYSIS, ROUTINE W REFLEX MICROSCOPIC
Bilirubin Urine: NEGATIVE
Glucose, UA: NEGATIVE mg/dL
Hgb urine dipstick: NEGATIVE
Ketones, ur: 80 mg/dL — AB
Nitrite: NEGATIVE
Protein, ur: 100 mg/dL — AB
Specific Gravity, Urine: 1.023 (ref 1.005–1.030)
pH: 5 (ref 5.0–8.0)

## 2022-08-09 LAB — FETAL FIBRONECTIN: Fetal Fibronectin: NEGATIVE

## 2022-08-09 MED ORDER — ALBUTEROL SULFATE (2.5 MG/3ML) 0.083% IN NEBU
2.5000 mg | INHALATION_SOLUTION | Freq: Once | RESPIRATORY_TRACT | Status: AC
  Administered 2022-08-09: 2.5 mg via RESPIRATORY_TRACT
  Filled 2022-08-09: qty 3

## 2022-08-09 MED ORDER — NIFEDIPINE 10 MG PO CAPS
10.0000 mg | ORAL_CAPSULE | ORAL | Status: AC | PRN
  Administered 2022-08-09 – 2022-08-10 (×3): 10 mg via ORAL
  Filled 2022-08-09 (×3): qty 1

## 2022-08-09 MED ORDER — LACTATED RINGERS IV BOLUS
1000.0000 mL | Freq: Once | INTRAVENOUS | Status: AC
  Administered 2022-08-09: 1000 mL via INTRAVENOUS

## 2022-08-09 NOTE — MAU Note (Signed)
.  Sandra Roach is a 22 y.o. at [redacted]w[redacted]d here in MAU reporting: Wednesday started having cough - been coughing mucous with blood in it, hard to breath, body aches, sweats, chest pain and chills. Tried tylenol at 1500 - has not helped. Stated was running a fever, but not currently. Denies VB or LOF. +FM   Onset of complaint: Wednesday  Pain score: 8 Vitals:   08/09/22 1936  BP: 126/68  Pulse: (!) 103  Resp: 18  Temp: 98.8 F (37.1 C)  SpO2: 97%     FHT:150 Lab orders placed from triage:  UA

## 2022-08-09 NOTE — MAU Provider Note (Signed)
Chief Complaint:  Generalized Body Aches (/), Chills (/), Cough, Chest Pain, and Shortness of Breath   Event Date/Time   First Provider Initiated Contact with Patient 08/09/22 2018      HPI: Sandra Roach is a 22 y.o. G2P1001 at [redacted]w[redacted]d who presents to maternity admissions reporting flu like symptoms with cough, fever, shortness of breath x 3 days. She reports worsening shortness of breath with chest pain today.  She denies known sick contacts.   She reports good fetal movement, denies regular contractions, LOF, or vaginal bleeding.     HPI  Past Medical History: Past Medical History:  Diagnosis Date   Constipation     Past obstetric history: OB History  Gravida Para Term Preterm AB Living  2 1 1     1   SAB IAB Ectopic Multiple Live Births          1    # Outcome Date GA Lbr Len/2nd Weight Sex Delivery Anes PTL Lv  2 Current           1 Term 2018     Vag-Spont   LIV    Past Surgical History: Past Surgical History:  Procedure Laterality Date   NO PAST SURGERIES      Family History: Family History  Problem Relation Age of Onset   Cancer Mother     Social History: Social History   Tobacco Use   Smoking status: Never   Smokeless tobacco: Never  Vaping Use   Vaping Use: Never used  Substance Use Topics   Alcohol use: Not Currently   Drug use: Not Currently    Types: Marijuana    Allergies: No Known Allergies  Meds:  Medications Prior to Admission  Medication Sig Dispense Refill Last Dose   acetaminophen (TYLENOL) 325 MG tablet Take 650 mg by mouth every 6 (six) hours as needed.   08/09/2022 at 1500   cetirizine (ZYRTEC ALLERGY) 10 MG tablet Take 1 tablet (10 mg total) by mouth daily. 30 tablet 0    cyclobenzaprine (FLEXERIL) 5 MG tablet Take 1 tablet (5 mg total) by mouth 3 (three) times daily as needed for muscle spasms (headache). 30 tablet 1 Unknown   ondansetron (ZOFRAN-ODT) 4 MG disintegrating tablet Take 1 tablet (4 mg total) by mouth every 8 (eight)  hours as needed for nausea or vomiting. 20 tablet 0 Unknown    ROS:  Review of Systems  Constitutional:  Positive for chills and fever. Negative for fatigue.  HENT:  Positive for congestion.   Eyes:  Negative for visual disturbance.  Respiratory:  Positive for cough, chest tightness and shortness of breath.   Cardiovascular:  Positive for chest pain.  Gastrointestinal:  Negative for abdominal pain, nausea and vomiting.  Genitourinary:  Negative for difficulty urinating, dysuria, flank pain, pelvic pain, vaginal bleeding, vaginal discharge and vaginal pain.  Neurological:  Negative for dizziness and headaches.  Psychiatric/Behavioral: Negative.       I have reviewed patient's Past Medical Hx, Surgical Hx, Family Hx, Social Hx, medications and allergies.   Physical Exam  Patient Vitals for the past 24 hrs:  BP Temp Temp src Pulse Resp SpO2 Height Weight  08/10/22 0004 118/70 -- -- (!) 108 18 100 % -- --  08/09/22 2256 (!) 142/79 -- -- (!) 102 -- -- -- --  08/09/22 2137 -- 99.7 F (37.6 C) Oral -- -- -- -- --  08/09/22 2108 -- -- -- -- -- 100 % -- --  08/09/22 2005 -- -- -- -- --  99 % -- --  08/09/22 2000 -- 99.8 F (37.7 C) -- -- -- -- -- --  08/09/22 1957 124/62 -- -- (!) 114 -- -- -- --  08/09/22 1936 126/68 98.8 F (37.1 C) Oral (!) 103 18 97 % 5\' 5"  (1.651 m) 98.3 kg   Constitutional: Well-developed, well-nourished female in no acute distress.  HEART: mild tachycardia with normal heart sounds, regular rhythm RESP: lung sounds with subtle course rales throughout, evenly in all lobes  GI: Abd soft, non-tender, gravid appropriate for gestational age.  MS: Extremities nontender, no edema, normal ROM Neurologic: Alert and oriented x 4.  GU: Neg CVAT.  PELVIC EXAM: Deferred     FHT:  Baseline 150 , moderate variability, accelerations present, no decelerations Contractions: rare, mild to palpation   Labs: Results for orders placed or performed during the hospital  encounter of 08/09/22 (from the past 24 hour(s))  Urinalysis, Routine w reflex microscopic -Urine, Clean Catch     Status: Abnormal   Collection Time: 08/09/22  7:55 PM  Result Value Ref Range   Color, Urine AMBER (A) YELLOW   APPearance HAZY (A) CLEAR   Specific Gravity, Urine 1.023 1.005 - 1.030   pH 5.0 5.0 - 8.0   Glucose, UA NEGATIVE NEGATIVE mg/dL   Hgb urine dipstick NEGATIVE NEGATIVE   Bilirubin Urine NEGATIVE NEGATIVE   Ketones, ur 80 (A) NEGATIVE mg/dL   Protein, ur 100 (A) NEGATIVE mg/dL   Nitrite NEGATIVE NEGATIVE   Leukocytes,Ua TRACE (A) NEGATIVE   RBC / HPF 0-5 0 - 5 RBC/hpf   WBC, UA 6-10 0 - 5 WBC/hpf   Bacteria, UA FEW (A) NONE SEEN   Squamous Epithelial / HPF 6-10 0 - 5 /HPF   Mucus PRESENT    Hyaline Casts, UA PRESENT   Resp panel by RT-PCR (RSV, Flu A&B, Covid) Anterior Nasal Swab     Status: None   Collection Time: 08/09/22  7:55 PM   Specimen: Anterior Nasal Swab  Result Value Ref Range   SARS Coronavirus 2 by RT PCR NEGATIVE NEGATIVE   Influenza A by PCR NEGATIVE NEGATIVE   Influenza B by PCR NEGATIVE NEGATIVE   Resp Syncytial Virus by PCR NEGATIVE NEGATIVE  CBC with Differential/Platelet     Status: Abnormal   Collection Time: 08/09/22  8:44 PM  Result Value Ref Range   WBC 7.4 4.0 - 10.5 K/uL   RBC 3.98 3.87 - 5.11 MIL/uL   Hemoglobin 11.5 (L) 12.0 - 15.0 g/dL   HCT 34.1 (L) 36.0 - 46.0 %   MCV 85.7 80.0 - 100.0 fL   MCH 28.9 26.0 - 34.0 pg   MCHC 33.7 30.0 - 36.0 g/dL   RDW 12.6 11.5 - 15.5 %   Platelets 175 150 - 400 K/uL   nRBC 0.0 0.0 - 0.2 %   Neutrophils Relative % 76 %   Neutro Abs 5.6 1.7 - 7.7 K/uL   Lymphocytes Relative 12 %   Lymphs Abs 0.9 0.7 - 4.0 K/uL   Monocytes Relative 8 %   Monocytes Absolute 0.6 0.1 - 1.0 K/uL   Eosinophils Relative 2 %   Eosinophils Absolute 0.1 0.0 - 0.5 K/uL   Basophils Relative 0 %   Basophils Absolute 0.0 0.0 - 0.1 K/uL   Immature Granulocytes 2 %   Abs Immature Granulocytes 0.15 (H) 0.00 - 0.07  K/uL  Comprehensive metabolic panel     Status: Abnormal   Collection Time: 08/09/22  8:44 PM  Result  Value Ref Range   Sodium 133 (L) 135 - 145 mmol/L   Potassium 3.1 (L) 3.5 - 5.1 mmol/L   Chloride 104 98 - 111 mmol/L   CO2 18 (L) 22 - 32 mmol/L   Glucose, Bld 108 (H) 70 - 99 mg/dL   BUN <5 (L) 6 - 20 mg/dL   Creatinine, Ser 0.61 0.44 - 1.00 mg/dL   Calcium 8.5 (L) 8.9 - 10.3 mg/dL   Total Protein 6.3 (L) 6.5 - 8.1 g/dL   Albumin 2.6 (L) 3.5 - 5.0 g/dL   AST 31 15 - 41 U/L   ALT 15 0 - 44 U/L   Alkaline Phosphatase 123 38 - 126 U/L   Total Bilirubin 0.6 0.3 - 1.2 mg/dL   GFR, Estimated >60 >60 mL/min   Anion gap 11 5 - 15  Fetal fibronectin     Status: None   Collection Time: 08/09/22 10:42 PM  Result Value Ref Range   Fetal Fibronectin NEGATIVE NEGATIVE   --/--/A NEG (01/04 0222)  Imaging:  DG Chest Portable 1 View  Result Date: 08/09/2022 CLINICAL DATA:  Chest pain and shortness of breath EXAM: PORTABLE CHEST 1 VIEW COMPARISON:  02/10/2018 FINDINGS: The heart size and mediastinal contours are within normal limits. Both lungs are clear. The visualized skeletal structures are unremarkable. IMPRESSION: No active disease. Electronically Signed   By: Inez Catalina M.D.   On: 08/09/2022 21:02    MAU Course/MDM: Orders Placed This Encounter  Procedures   Resp panel by RT-PCR (RSV, Flu A&B, Covid) Anterior Nasal Swab   DG Chest Portable 1 View   Urinalysis, Routine w reflex microscopic -Urine, Clean Catch   CBC with Differential/Platelet   Comprehensive metabolic panel   Fetal fibronectin   Discharge patient    Meds ordered this encounter  Medications   lactated ringers bolus 1,000 mL   albuterol (PROVENTIL) (2.5 MG/3ML) 0.083% nebulizer solution 2.5 mg   lactated ringers bolus 1,000 mL   NIFEdipine (PROCARDIA) capsule 10 mg   albuterol (PROVENTIL) (2.5 MG/3ML) 0.083% nebulizer solution 2.5 mg   benzonatate (TESSALON) 100 MG capsule    Sig: Take 1 capsule (100 mg total)  by mouth every 8 (eight) hours.    Dispense:  21 capsule    Refill:  0    Order Specific Question:   Supervising Provider    Answer:   Verita Schneiders A [[id number]]   albuterol (VENTOLIN HFA) 108 (90 Base) MCG/ACT inhaler    Sig: Inhale 2 puffs into the lungs every 6 (six) hours as needed for wheezing or shortness of breath.    Dispense:  1 each    Refill:  0    Order Specific Question:   Supervising Provider    Answer:   Verita Schneiders A [[id number]]     NST reviewed and reactive Initially, pt with rare contractions and assessment and treatments directed towards respiratory symptoms.  Nebulizer breathing tx given and pt SOB improved CXR wnl, no evidence of pneumonia   Dehydration noted on UA, IV fluids x 2000 ml given in MAU Pt reported onset of painful contractions every 4-5 minutes Cervix FT/long/ballotable but frequent Ctx so FFN sent and negative Procardia 10 mg x 3 doses given with resolution of contractions D/C home with PTL and respiratory precautions Rx for Tessalon Perles and albuterol inhaler and list of safe OTC medications given  Assessment: 1. Preterm uterine contractions in third trimester, antepartum   2. Flu-like symptoms   3. [redacted] weeks gestation of  pregnancy   4. Shortness of breath     Plan: Discharge home Labor precautions and fetal kick counts  Follow-up Information     [facility] Follow up.   Specialty: Obstetrics and Gynecology Why: As scheduled Contact information: [address] [place] [address] [phone]        [facility] Follow up.   Specialty: Obstetrics and Gynecology Why: As needed for emergencies Contact information: [address] [id number] [place] [address] [phone]               Allergies as of 08/10/2022   No Known Allergies      Medication List     TAKE these medications    acetaminophen 325 MG  tablet Commonly known as: TYLENOL Take 650 mg by mouth every 6 (six) hours as needed.   albuterol 108 (90 Base) MCG/ACT inhaler Commonly known as: Ventolin HFA Inhale 2 puffs into the lungs every 6 (six) hours as needed for wheezing or shortness of breath.   benzonatate 100 MG capsule Commonly known as: TESSALON Take 1 capsule (100 mg total) by mouth every 8 (eight) hours.   cetirizine 10 MG tablet Commonly known as: ZyrTEC Allergy Take 1 tablet (10 mg total) by mouth daily.   cyclobenzaprine 5 MG tablet Commonly known as: FLEXERIL Take 1 tablet (5 mg total) by mouth 3 (three) times daily as needed for muscle spasms (headache).   ondansetron 4 MG disintegrating tablet Commonly known as: ZOFRAN-ODT Take 1 tablet (4 mg total) by mouth every 8 (eight) hours as needed for nausea or vomiting.        Sharen Counter Certified Nurse-Midwife 08/10/2022 1:32 AM

## 2022-08-10 DIAGNOSIS — R0602 Shortness of breath: Secondary | ICD-10-CM

## 2022-08-10 DIAGNOSIS — Z3A32 32 weeks gestation of pregnancy: Secondary | ICD-10-CM | POA: Diagnosis not present

## 2022-08-10 DIAGNOSIS — R6889 Other general symptoms and signs: Secondary | ICD-10-CM | POA: Diagnosis not present

## 2022-08-10 DIAGNOSIS — O4703 False labor before 37 completed weeks of gestation, third trimester: Secondary | ICD-10-CM | POA: Diagnosis not present

## 2022-08-10 DIAGNOSIS — O26893 Other specified pregnancy related conditions, third trimester: Secondary | ICD-10-CM | POA: Diagnosis not present

## 2022-08-10 MED ORDER — ALBUTEROL SULFATE HFA 108 (90 BASE) MCG/ACT IN AERS: 2.0000 | INHALATION_SPRAY | Freq: Four times a day (QID) | RESPIRATORY_TRACT | 0 refills | Status: AC | PRN

## 2022-08-10 MED ORDER — BENZONATATE 100 MG PO CAPS: 100.0000 mg | ORAL_CAPSULE | Freq: Three times a day (TID) | ORAL | 0 refills | Status: DC

## 2022-08-10 MED ORDER — ACETAMINOPHEN 500 MG PO TABS
1000.0000 mg | ORAL_TABLET | Freq: Once | ORAL | Status: AC
  Administered 2022-08-10: 1000 mg via ORAL
  Filled 2022-08-10: qty 2

## 2022-08-10 MED ORDER — ALBUTEROL SULFATE (2.5 MG/3ML) 0.083% IN NEBU: 2.5000 mg | INHALATION_SOLUTION | Freq: Once | RESPIRATORY_TRACT | Status: DC

## 2022-08-10 NOTE — Discharge Instructions (Signed)
  Safe Medications in Pregnancy   Acne: Benzoyl Peroxide Salicylic Acid  Backache/Headache: Tylenol: 2 regular strength every 4 hours OR              2 Extra strength every 6 hours  Colds/Coughs/Allergies: Benadryl (alcohol free) 25 mg every 6 hours as needed Breath right strips Claritin Cepacol throat lozenges Chloraseptic throat spray Cold-Eeze- up to three times per day Cough drops, alcohol free Flonase (by prescription only) Guaifenesin Mucinex Robitussin DM (plain only, alcohol free) Saline nasal spray/drops Sudafed (pseudoephedrine) & Actifed ** use only after [redacted] weeks gestation and if you do not have high blood pressure Tylenol Vicks Vaporub Zinc lozenges Zyrtec   Constipation: Colace Ducolax suppositories Fleet enema Glycerin suppositories Metamucil Milk of magnesia Miralax Senokot Smooth move tea  Diarrhea: Kaopectate Imodium A-D  *NO pepto Bismol  Hemorrhoids: Anusol Anusol HC Preparation H Tucks  Indigestion: Tums Maalox Mylanta Zantac  Pepcid  Insomnia: Benadryl (alcohol free) '25mg'$  every 6 hours as needed Tylenol PM Unisom, no Gelcaps  Leg Cramps: Tums MagGel  Nausea/Vomiting:  Bonine Dramamine Emetrol Ginger extract Sea bands Meclizine  Nausea medication to take during pregnancy:  Unisom (doxylamine succinate 25 mg tablets) Take one tablet daily at bedtime. If symptoms are not adequately controlled, the dose can be increased to a maximum recommended dose of two tablets daily (1/2 tablet in the morning, 1/2 tablet mid-afternoon and one at bedtime). Vitamin B6 '100mg'$  tablets. Take one tablet twice a day (up to 200 mg per day).  Skin Rashes: Aveeno products Benadryl cream or '25mg'$  every 6 hours as needed Calamine Lotion 1% cortisone cream  Yeast infection: Gyne-lotrimin 7 Monistat 7   **If taking multiple medications, please check labels to avoid duplicating the same active ingredients **take medication as directed on  the label ** Do not exceed 4000 mg of tylenol in 24 hours **Do not take medications that contain aspirin or ibuprofen

## 2022-08-10 NOTE — MAU Note (Signed)
Pt requesting tylenol prior to discharge

## 2022-08-12 ENCOUNTER — Inpatient Hospital Stay (HOSPITAL_COMMUNITY)
Admission: AD | Admit: 2022-08-12 | Discharge: 2022-08-12 | Disposition: A | Discharge status: Home / Self Care | Payer: Medicaid Other | Attending: Obstetrics & Gynecology | Admitting: Obstetrics & Gynecology

## 2022-08-12 ENCOUNTER — Encounter (HOSPITAL_COMMUNITY): Payer: Self-pay | Admitting: Obstetrics & Gynecology

## 2022-08-12 DIAGNOSIS — O99323 Drug use complicating pregnancy, third trimester: Secondary | ICD-10-CM | POA: Diagnosis not present

## 2022-08-12 DIAGNOSIS — J101 Influenza due to other identified influenza virus with other respiratory manifestations: Secondary | ICD-10-CM | POA: Diagnosis not present

## 2022-08-12 DIAGNOSIS — Z1152 Encounter for screening for COVID-19: Secondary | ICD-10-CM | POA: Diagnosis not present

## 2022-08-12 DIAGNOSIS — Z3A33 33 weeks gestation of pregnancy: Secondary | ICD-10-CM | POA: Insufficient documentation

## 2022-08-12 DIAGNOSIS — R633 Feeding difficulties, unspecified: Secondary | ICD-10-CM | POA: Diagnosis not present

## 2022-08-12 DIAGNOSIS — O212 Late vomiting of pregnancy: Secondary | ICD-10-CM | POA: Insufficient documentation

## 2022-08-12 DIAGNOSIS — O99513 Diseases of the respiratory system complicating pregnancy, third trimester: Secondary | ICD-10-CM | POA: Insufficient documentation

## 2022-08-12 DIAGNOSIS — R070 Pain in throat: Secondary | ICD-10-CM | POA: Insufficient documentation

## 2022-08-12 DIAGNOSIS — J040 Acute laryngitis: Secondary | ICD-10-CM | POA: Insufficient documentation

## 2022-08-12 DIAGNOSIS — Z3689 Encounter for other specified antenatal screening: Secondary | ICD-10-CM

## 2022-08-12 DIAGNOSIS — F1290 Cannabis use, unspecified, uncomplicated: Secondary | ICD-10-CM | POA: Diagnosis not present

## 2022-08-12 DIAGNOSIS — O26893 Other specified pregnancy related conditions, third trimester: Secondary | ICD-10-CM | POA: Diagnosis not present

## 2022-08-12 LAB — URINALYSIS, ROUTINE W REFLEX MICROSCOPIC
Glucose, UA: NEGATIVE mg/dL
Hgb urine dipstick: NEGATIVE
Ketones, ur: 5 mg/dL — AB
Nitrite: NEGATIVE
Protein, ur: 100 mg/dL — AB
Specific Gravity, Urine: 1.026 (ref 1.005–1.030)
pH: 5 (ref 5.0–8.0)

## 2022-08-12 LAB — AMNISURE RUPTURE OF MEMBRANE (ROM) NOT AT ARMC: Amnisure ROM: NEGATIVE

## 2022-08-12 LAB — RESP PANEL BY RT-PCR (RSV, FLU A&B, COVID)  RVPGX2
Influenza A by PCR: NEGATIVE
Influenza B by PCR: POSITIVE — AB
Resp Syncytial Virus by PCR: NEGATIVE
SARS Coronavirus 2 by RT PCR: NEGATIVE

## 2022-08-12 MED ORDER — OSELTAMIVIR PHOSPHATE 75 MG PO CAPS
75.0000 mg | ORAL_CAPSULE | Freq: Two times a day (BID) | ORAL | Status: DC
  Filled 2022-08-12 (×2): qty 1

## 2022-08-12 MED ORDER — PHENOL 1.4 % MT LIQD: 1.0000 | OROMUCOSAL | 0 refills | Status: DC | PRN

## 2022-08-12 MED ORDER — LACTATED RINGERS IV BOLUS
1000.0000 mL | Freq: Once | INTRAVENOUS | Status: AC
  Administered 2022-08-12: 1000 mL via INTRAVENOUS

## 2022-08-12 MED ORDER — PHENOL 1.4 % MT LIQD
1.0000 | OROMUCOSAL | Status: DC | PRN
  Administered 2022-08-12: 1 via OROMUCOSAL
  Filled 2022-08-12: qty 177

## 2022-08-12 MED ORDER — OSELTAMIVIR PHOSPHATE 75 MG PO CAPS: 75.0000 mg | ORAL_CAPSULE | Freq: Two times a day (BID) | ORAL | 0 refills | Status: AC

## 2022-08-12 MED ORDER — ONDANSETRON 4 MG PO TBDP
4.0000 mg | ORAL_TABLET | Freq: Once | ORAL | Status: AC
  Administered 2022-08-12: 4 mg via ORAL
  Filled 2022-08-12: qty 1

## 2022-08-12 MED ORDER — MENTHOL 3 MG MT LOZG: 1.0000 | LOZENGE | OROMUCOSAL | 12 refills | Status: AC | PRN

## 2022-08-12 MED ORDER — ACETAMINOPHEN 325 MG PO TABS
650.0000 mg | ORAL_TABLET | Freq: Once | ORAL | Status: AC
  Administered 2022-08-12: 650 mg via ORAL
  Filled 2022-08-12: qty 2

## 2022-08-12 MED ORDER — MENTHOL 3 MG MT LOZG: 1.0000 | LOZENGE | OROMUCOSAL | Status: DC | PRN

## 2022-08-12 NOTE — MAU Provider Note (Addendum)
History     CSN: [id number]  Arrival date and time: 08/12/22 1025   Event Date/Time   First Provider Initiated Contact with Patient 08/12/22 1109      Chief Complaint  Patient presents with   Body Aches   No Taste or Smell   Laryngitis   Nausea   Emesis   HPI  Sandra Roach is a 22 y.o. G2P1001 at [redacted]w[redacted]d  presenting with cough, subjective fevers and difficulty speaking. States symptoms began about 7 days ago but the difficulty speaking started 2-3 days ago. Some SOB and reports wheezing, take the albuterol inhaler q6h without relief. Taking Mucinex and Robatussin without relief. Denies sick contacts but kids in the home. Difficulty eating and drinking due to throat pain. Endorse nausea and some dry heaving that mostly occurs with cough. Patient seen in the MAU on 08/09/2022 for similar symptoms.  Endorses constantly leaking for the past week. Unsure if it is urine or other fluid. Denies vaginal bleeding. +FM  OB History     Gravida  2   Para  1   Term  1   Preterm      AB      Living  1      SAB      IAB      Ectopic      Multiple      Live Births  1           Past Medical History:  Diagnosis Date   Constipation     Past Surgical History:  Procedure Laterality Date   NO PAST SURGERIES      Family History  Problem Relation Age of Onset   Cancer Mother     Social History   Tobacco Use   Smoking status: Never   Smokeless tobacco: Never  Vaping Use   Vaping Use: Never used  Substance Use Topics   Alcohol use: Not Currently   Drug use: Not Currently    Types: Marijuana    Allergies: No Known Allergies  Medications Prior to Admission  Medication Sig Dispense Refill Last Dose   acetaminophen (TYLENOL) 325 MG tablet Take 650 mg by mouth every 6 (six) hours as needed.   Past Week   albuterol (VENTOLIN HFA) 108 (90 Base) MCG/ACT inhaler Inhale 2 puffs into the lungs every 6 (six) hours as needed for wheezing or shortness of breath. 1 each  0    benzonatate (TESSALON) 100 MG capsule Take 1 capsule (100 mg total) by mouth every 8 (eight) hours. 21 capsule 0    cetirizine (ZYRTEC ALLERGY) 10 MG tablet Take 1 tablet (10 mg total) by mouth daily. 30 tablet 0    cyclobenzaprine (FLEXERIL) 5 MG tablet Take 1 tablet (5 mg total) by mouth 3 (three) times daily as needed for muscle spasms (headache). 30 tablet 1    ondansetron (ZOFRAN-ODT) 4 MG disintegrating tablet Take 1 tablet (4 mg total) by mouth every 8 (eight) hours as needed for nausea or vomiting. 20 tablet 0     Review of Systems Constitutional:  Positive for chills and fever.  HENT:  Positive for congestion.   Eyes:  Negative for visual disturbance.  Respiratory:  Positive for cough, difficulty speaking, and shortness of breath.   Cardiovascular:  Negative for chest pain. Gastrointestinal:  Negative for abdominal pain. Positive for nausea. Genitourinary:  Negative for difficulty urinating, dysuria, flank pain, pelvic pain, vaginal bleeding, vaginal discharge and vaginal pain. Positive for leakage of fluid. Neurological:  Negative for dizziness and headaches.  Psychiatric/Behavioral: Negative.     Physical Exam   Blood pressure 117/81, pulse 98, temperature 98.8 F (37.1 C), temperature source Oral, resp. rate 20, weight 97.3 kg, SpO2 99 %, unknown if currently breastfeeding.  Physical Exam Constitutional: Fatigued. Speaking at a whisper. HEART: RRR without murmur RESP: Minimal end expiratory wheezing. Normal WOB on RA. Intermittent coughing GI: Abd soft, non-tender, gravid appropriate for gestational age.  MS: Extremities nontender, no edema, normal ROM Neurologic: Alert and oriented x 4.  Sterile Speculum Exam: -Normal External Genitalia: Non tender, no apparent discharge at introitus.  -Vaginal Vault: Pink mucosa with good rugae. No pooling. Negative valsalva. -Cervix:Pink, no lesions, cysts, or polyps.  Appears closed. No active bleeding or leaking from  os.   MAU Course  Procedures  MDM NST reviewed and reactive. Patient report good FM, EFM discontinued. 1130: Decreased PO intake due to pharyngeal pain, patient preferred IV hydration with 1L LR vs oral challenge. Zofran 4mg  for nausea. Tylenol and chloraseptic throat spray for pain relief. Swab for COVID/flu pending.  1200: Fern and AmniSure obtained and pending.   1220: Positive for Influenza B. Discussed risk and benefits for Tamiflu including decreased efficacy given >48 hours since sx onset but patient would like to proceed with treatment. Cepacol lozenges ordered to be used with the spray.  1240: AmniSure negative. Case discussed with attending Dr. Elly Modena, agreed to discharge patient home.   Assessment and Plan   [redacted] week gestation pregnancy Influenza B with laryngitis  Discharged home with Tamiflu 75mg  BID x 5 days, supportive care and return precautions.   Colletta Maryland 08/12/2022, 11:28 AM   Meds ordered this encounter  Medications   acetaminophen (TYLENOL) tablet 650 mg   ondansetron (ZOFRAN-ODT) disintegrating tablet 4 mg   lactated ringers bolus 1,000 mL   phenol (CHLORASEPTIC) mouth spray 1 spray   menthol-cetylpyridinium (CEPACOL) lozenge 3 mg   DISCONTD: oseltamivir (TAMIFLU) capsule 75 mg   oseltamivir (TAMIFLU) 75 MG capsule    Sig: Take 1 capsule (75 mg total) by mouth 2 (two) times daily for 5 days.    Dispense:  10 capsule    Refill:  0   menthol-cetylpyridinium (CEPACOL) 3 MG lozenge    Sig: Take 1 lozenge (3 mg total) by mouth as needed for sore throat.    Dispense:  100 tablet    Refill:  12   phenol (CHLORASEPTIC) 1.4 % LIQD    Sig: Use as directed 1 spray in the mouth or throat as needed for throat irritation / pain.    Dispense:  177 mL    Refill:  0    Attestation of Supervision of Resident:  I confirm that I have verified the information documented in the resident's note and that I have also personally  reviewed and supervised  the  history, physical exam and all medical decision making activities.  I have verified that all services and findings are accurately documented in this student's note; and I agree with management and plan as outlined in the documentation. I have also made any necessary editorial changes.  --Flu B, continue symptom management --Patient requests Tamiflu due to pregnancy, aware she is outside typical window for initiation --No acute symptoms at this time --33 weeks, reactive tracing, intact amniotic sac  Darlina Rumpf, [place] for [employer], [place] 08/12/2022 1:31 PM

## 2022-08-12 NOTE — MAU Note (Signed)
Sandra Roach is a 22 y.o. at [redacted]w[redacted]d here in MAU reporting: no taste or smell, body aches, N/V, and no voice.  Reports symptoms began 1 week ago, negative Covid in MAU 3 days ago.  Reports was taking Mucinex, now taking Robitussin.  Last took Robitussin this morning @ 0800. Denies VB or LOF.  Reports no FM today. LMP: NA Onset of complaint: 1 week ago Pain score: 7 body aches Vitals:   08/12/22 1044  BP: 117/81  Pulse: 98  Resp: 20  Temp: 98.8 F (37.1 C)  SpO2: 99%     FHT:132 bpm Lab orders placed from triage:   UA

## 2022-08-30 ENCOUNTER — Inpatient Hospital Stay (HOSPITAL_COMMUNITY)
Admission: AD | Admit: 2022-08-30 | Discharge: 2022-08-30 | Disposition: A | Discharge status: Home / Self Care | Payer: Medicaid Other | Attending: Obstetrics and Gynecology | Admitting: Obstetrics and Gynecology

## 2022-08-30 ENCOUNTER — Encounter (HOSPITAL_COMMUNITY): Payer: Self-pay | Admitting: Obstetrics and Gynecology

## 2022-08-30 ENCOUNTER — Inpatient Hospital Stay (HOSPITAL_BASED_OUTPATIENT_CLINIC_OR_DEPARTMENT_OTHER): Payer: Medicaid Other

## 2022-08-30 DIAGNOSIS — O4703 False labor before 37 completed weeks of gestation, third trimester: Secondary | ICD-10-CM

## 2022-08-30 DIAGNOSIS — Z3A35 35 weeks gestation of pregnancy: Secondary | ICD-10-CM

## 2022-08-30 DIAGNOSIS — M549 Dorsalgia, unspecified: Secondary | ICD-10-CM | POA: Insufficient documentation

## 2022-08-30 DIAGNOSIS — O9A213 Injury, poisoning and certain other consequences of external causes complicating pregnancy, third trimester: Secondary | ICD-10-CM

## 2022-08-30 DIAGNOSIS — W182XXA Fall in (into) shower or empty bathtub, initial encounter: Secondary | ICD-10-CM | POA: Diagnosis not present

## 2022-08-30 DIAGNOSIS — R109 Unspecified abdominal pain: Secondary | ICD-10-CM | POA: Insufficient documentation

## 2022-08-30 DIAGNOSIS — Z3689 Encounter for other specified antenatal screening: Secondary | ICD-10-CM

## 2022-08-30 HISTORY — DX: Unspecified asthma, uncomplicated: J45.909

## 2022-08-30 LAB — URINALYSIS, ROUTINE W REFLEX MICROSCOPIC
Bilirubin Urine: NEGATIVE
Glucose, UA: NEGATIVE mg/dL
Hgb urine dipstick: NEGATIVE
Ketones, ur: NEGATIVE mg/dL
Nitrite: NEGATIVE
Protein, ur: 30 mg/dL — AB
Specific Gravity, Urine: 1.025 (ref 1.005–1.030)
pH: 6 (ref 5.0–8.0)

## 2022-08-30 LAB — URINALYSIS, MICROSCOPIC (REFLEX)

## 2022-08-30 MED ORDER — ACETAMINOPHEN 500 MG PO TABS
1000.0000 mg | ORAL_TABLET | Freq: Four times a day (QID) | ORAL | Status: DC | PRN
  Administered 2022-08-30: 1000 mg via ORAL
  Filled 2022-08-30: qty 2

## 2022-08-30 MED ORDER — CYCLOBENZAPRINE HCL 5 MG PO TABS
10.0000 mg | ORAL_TABLET | Freq: Three times a day (TID) | ORAL | Status: DC | PRN
  Administered 2022-08-30: 10 mg via ORAL
  Filled 2022-08-30: qty 2

## 2022-08-30 MED ORDER — LACTATED RINGERS IV BOLUS
1000.0000 mL | Freq: Once | INTRAVENOUS | Status: AC
  Administered 2022-08-30: 1000 mL via INTRAVENOUS

## 2022-08-30 MED ORDER — TERBUTALINE SULFATE 1 MG/ML IJ SOLN
0.2500 mg | Freq: Once | INTRAMUSCULAR | Status: AC
  Administered 2022-08-30: 0.25 mg via SUBCUTANEOUS
  Filled 2022-08-30: qty 1

## 2022-08-30 NOTE — MAU Note (Signed)
...  Sandra Roach is a 22 y.o. at 58w4dhere in MAU reporting: FGolden Circleyesterday in the shower 1400. She reports since then she has been experiencing bilateral abdominal pain, upper abdominal pain, and mid back pain that is constant and tight. She denies VB or LOF. +FM.   She denies hitting her abdomen in the shower. She reports she just hit her back. Has not taken any medications.  Onset of complaint: Yesterday Pain score:  8/10 bilateral mid abdominal pain 8/10 upper abdominal pain 8/10 mid back pain  FHT: 145 initial external Lab orders placed from triage:  UA

## 2022-08-30 NOTE — MAU Provider Note (Cosign Needed Addendum)
History     CSN: [id number]  Arrival date and time: 08/30/22 1637   Event Date/Time   First Provider Initiated Contact with Patient 08/30/22 1709      Chief Complaint  Patient presents with   Back Pain   Fall   Abdominal Pain   22 y.o. G2P1001 @35$ .4 wks presenting with LBP and abdominal tightening. Reports onset of sx last night after she fell in the shower. States her feet got slippery from the soap and she fell back onto her back and buttocks. Denies any abdominal trauma, head trauma or syncope. Pain became worse today and tightening is more often, she is unsure of frequency. Rates pain 8/10. Has not taken anything. Denies VB or LOF. Reports good FM.     OB History     Gravida  2   Para  1   Term  1   Preterm      AB      Living  1      SAB      IAB      Ectopic      Multiple      Live Births  1           Past Medical History:  Diagnosis Date   Asthma    Constipation     Past Surgical History:  Procedure Laterality Date   NO PAST SURGERIES      Family History  Problem Relation Age of Onset   Cancer Mother     Social History   Tobacco Use   Smoking status: Never   Smokeless tobacco: Never  Vaping Use   Vaping Use: Never used  Substance Use Topics   Alcohol use: Not Currently   Drug use: Not Currently    Types: Marijuana    Allergies: No Known Allergies  Medications Prior to Admission  Medication Sig Dispense Refill Last Dose   acetaminophen (TYLENOL) 325 MG tablet Take 650 mg by mouth every 6 (six) hours as needed.   Past Week   albuterol (VENTOLIN HFA) 108 (90 Base) MCG/ACT inhaler Inhale 2 puffs into the lungs every 6 (six) hours as needed for wheezing or shortness of breath. 1 each 0 08/30/2022   benzonatate (TESSALON) 100 MG capsule Take 1 capsule (100 mg total) by mouth every 8 (eight) hours. 21 capsule 0 Unknown   cetirizine (ZYRTEC ALLERGY) 10 MG tablet Take 1 tablet (10 mg total) by mouth daily. 30 tablet 0 Unknown    menthol-cetylpyridinium (CEPACOL) 3 MG lozenge Take 1 lozenge (3 mg total) by mouth as needed for sore throat. 100 tablet 12 Unknown   ondansetron (ZOFRAN-ODT) 4 MG disintegrating tablet Take 1 tablet (4 mg total) by mouth every 8 (eight) hours as needed for nausea or vomiting. (Patient not taking: Reported on 08/30/2022) 20 tablet 0 Not Taking   phenol (CHLORASEPTIC) 1.4 % LIQD Use as directed 1 spray in the mouth or throat as needed for throat irritation / pain. 177 mL 0 Unknown    Review of Systems  Gastrointestinal:  Positive for abdominal pain.  Genitourinary:  Negative for vaginal bleeding and vaginal discharge.  Musculoskeletal:  Positive for back pain.   Physical Exam   Blood pressure 114/77, pulse 99, temperature 98 F (36.7 C), temperature source Oral, resp. rate 15, height 5' 5"$  (1.651 m), weight 95.7 kg, SpO2 98 %, unknown if currently breastfeeding.  Physical Exam Vitals and nursing note reviewed. Exam conducted with a chaperone present.  Constitutional:  General: She is not in acute distress.    Appearance: Normal appearance.  HENT:     Head: Normocephalic and atraumatic.  Pulmonary:     Effort: Pulmonary effort is normal. No respiratory distress.  Abdominal:     Palpations: Abdomen is soft.     Tenderness: There is no abdominal tenderness.  Musculoskeletal:        General: Normal range of motion.     Cervical back: Normal range of motion.  Skin:    General: Skin is warm and dry.  Neurological:     General: No focal deficit present.     Mental Status: She is alert and oriented to person, place, and time.  Psychiatric:        Mood and Affect: Mood normal.        Behavior: Behavior normal.   EFM: 145 bpm, mod variability, + accels, no decels Toco: 2-4  Results for orders placed or performed during the hospital encounter of 08/30/22 (from the past 24 hour(s))  Urinalysis, Routine w reflex microscopic -Urine, Clean Catch     Status: Abnormal   Collection Time:  08/30/22  5:08 PM  Result Value Ref Range   Color, Urine YELLOW YELLOW   APPearance CLEAR CLEAR   Specific Gravity, Urine 1.025 1.005 - 1.030   pH 6.0 5.0 - 8.0   Glucose, UA NEGATIVE NEGATIVE mg/dL   Hgb urine dipstick NEGATIVE NEGATIVE   Bilirubin Urine NEGATIVE NEGATIVE   Ketones, ur NEGATIVE NEGATIVE mg/dL   Protein, ur 30 (A) NEGATIVE mg/dL   Nitrite NEGATIVE NEGATIVE   Leukocytes,Ua TRACE (A) NEGATIVE  Urinalysis, Microscopic (reflex)     Status: Abnormal   Collection Time: 08/30/22  5:08 PM  Result Value Ref Range   RBC / HPF 0-5 0 - 5 RBC/hpf   WBC, UA 6-10 0 - 5 WBC/hpf   Bacteria, UA RARE (A) NONE SEEN   Squamous Epithelial / HPF 0-5 0 - 5 /HPF   Mucus PRESENT      Media Information    MAU Course  Procedures Flexeril Tylenol  MDM Labs and [place] ordered and reviewed.  1850: pt reports worsening ctx, ctx q2-5, cervix unchanged, Terb ordered. Transfer of care given to [place], [place]  08/30/2022 8:19 PM   Assessment and Plan   Reassessment (8:29 PM) -Prenatal records reviewed.  -Patient reports pain has improved. States contractions are less frequent and intense. -Patient declines cervical exam recheck. -Scheduled for next PNV on Monday. -Encouraged rest and hydration for the remainder of the night and tomorrow. -Return precautions given. -Encouraged to call primary office or return to MAU if symptoms worsen or with the onset of new symptoms. -Discharged to home in improved condition.  Maryann Conners MSN, CNM Advanced Practice Provider, [facility]

## 2022-09-01 LAB — OB RESULTS CONSOLE GBS: GBS: POSITIVE

## 2022-09-16 ENCOUNTER — Other Ambulatory Visit: Payer: Self-pay

## 2022-09-16 ENCOUNTER — Encounter (HOSPITAL_COMMUNITY): Payer: Self-pay | Admitting: Obstetrics & Gynecology

## 2022-09-16 ENCOUNTER — Inpatient Hospital Stay (HOSPITAL_COMMUNITY)
Admission: AD | Admit: 2022-09-16 | Discharge: 2022-09-20 | DRG: 807 | Disposition: A | Discharge status: Home / Self Care | Payer: Medicaid Other | Attending: Obstetrics and Gynecology | Admitting: Obstetrics and Gynecology

## 2022-09-16 DIAGNOSIS — O9902 Anemia complicating childbirth: Secondary | ICD-10-CM | POA: Diagnosis present

## 2022-09-16 DIAGNOSIS — O99824 Streptococcus B carrier state complicating childbirth: Secondary | ICD-10-CM | POA: Diagnosis present

## 2022-09-16 DIAGNOSIS — O1493 Unspecified pre-eclampsia, third trimester: Secondary | ICD-10-CM | POA: Diagnosis not present

## 2022-09-16 DIAGNOSIS — D509 Iron deficiency anemia, unspecified: Secondary | ICD-10-CM | POA: Diagnosis present

## 2022-09-16 DIAGNOSIS — O99214 Obesity complicating childbirth: Secondary | ICD-10-CM | POA: Diagnosis present

## 2022-09-16 DIAGNOSIS — O1404 Mild to moderate pre-eclampsia, complicating childbirth: Secondary | ICD-10-CM | POA: Diagnosis present

## 2022-09-16 DIAGNOSIS — Z3A38 38 weeks gestation of pregnancy: Secondary | ICD-10-CM

## 2022-09-16 DIAGNOSIS — O1490 Unspecified pre-eclampsia, unspecified trimester: Principal | ICD-10-CM | POA: Diagnosis present

## 2022-09-16 DIAGNOSIS — O1405 Mild to moderate pre-eclampsia, complicating the puerperium: Principal | ICD-10-CM | POA: Diagnosis present

## 2022-09-16 LAB — CBC
HCT: 35.5 % — ABNORMAL LOW (ref 36.0–46.0)
Hemoglobin: 11.2 g/dL — ABNORMAL LOW (ref 12.0–15.0)
MCH: 26.6 pg (ref 26.0–34.0)
MCHC: 31.5 g/dL (ref 30.0–36.0)
MCV: 84.3 fL (ref 80.0–100.0)
Platelets: 197 10*3/uL (ref 150–400)
RBC: 4.21 MIL/uL (ref 3.87–5.11)
RDW: 14.3 % (ref 11.5–15.5)
WBC: 8.7 10*3/uL (ref 4.0–10.5)
nRBC: 0 % (ref 0.0–0.2)

## 2022-09-16 LAB — COMPREHENSIVE METABOLIC PANEL
ALT: 8 U/L (ref 0–44)
AST: 21 U/L (ref 15–41)
Albumin: 2.6 g/dL — ABNORMAL LOW (ref 3.5–5.0)
Alkaline Phosphatase: 173 U/L — ABNORMAL HIGH (ref 38–126)
Anion gap: 12 (ref 5–15)
BUN: <5 mg/dL — ABNORMAL LOW (ref 6–20)
CO2: 17 mmol/L — ABNORMAL LOW (ref 22–32)
Calcium: 8.6 mg/dL — ABNORMAL LOW (ref 8.9–10.3)
Chloride: 107 mmol/L (ref 98–111)
Creatinine, Ser: 0.65 mg/dL (ref 0.44–1.00)
GFR, Estimated: >60 mL/min (ref >60)
Glucose, Bld: 94 mg/dL (ref 70–99)
Potassium: 3.6 mmol/L (ref 3.5–5.1)
Sodium: 136 mmol/L (ref 135–145)
Total Bilirubin: 0.6 mg/dL (ref 0.3–1.2)
Total Protein: 6.3 g/dL — ABNORMAL LOW (ref 6.5–8.1)

## 2022-09-16 LAB — TYPE AND SCREEN
ABO/RH(D): A NEG
Antibody Screen: POSITIVE

## 2022-09-16 LAB — PROTEIN / CREATININE RATIO, URINE
Creatinine, Urine: 181 mg/dL
Protein Creatinine Ratio: 0.33 mg/mg{Cre} — ABNORMAL HIGH (ref 0.00–0.15)
Total Protein, Urine: 59 mg/dL

## 2022-09-16 MED ORDER — LABETALOL HCL 5 MG/ML IV SOLN: 20.0000 mg | INTRAVENOUS | Status: DC | PRN

## 2022-09-16 MED ORDER — SOD CITRATE-CITRIC ACID 500-334 MG/5ML PO SOLN: 30.0000 mL | ORAL | Status: DC | PRN

## 2022-09-16 MED ORDER — LABETALOL HCL 5 MG/ML IV SOLN: 80.0000 mg | INTRAVENOUS | Status: DC | PRN

## 2022-09-16 MED ORDER — TERBUTALINE SULFATE 1 MG/ML IJ SOLN: 0.2500 mg | Freq: Once | INTRAMUSCULAR | Status: DC | PRN

## 2022-09-16 MED ORDER — LIDOCAINE HCL (PF) 1 % IJ SOLN: 30.0000 mL | INTRAMUSCULAR | Status: DC | PRN

## 2022-09-16 MED ORDER — PENICILLIN G POT IN DEXTROSE 60000 UNIT/ML IV SOLN
3.0000 10*6.[IU] | INTRAVENOUS | Status: DC
  Administered 2022-09-16 – 2022-09-17 (×4): 3 10*6.[IU] via INTRAVENOUS
  Filled 2022-09-16 (×4): qty 50

## 2022-09-16 MED ORDER — LABETALOL HCL 5 MG/ML IV SOLN: 40.0000 mg | INTRAVENOUS | Status: DC | PRN

## 2022-09-16 MED ORDER — ACETAMINOPHEN 325 MG PO TABS: 650.0000 mg | ORAL_TABLET | ORAL | Status: DC | PRN

## 2022-09-16 MED ORDER — FENTANYL CITRATE (PF) 100 MCG/2ML IJ SOLN
100.0000 ug | INTRAMUSCULAR | Status: DC | PRN
  Administered 2022-09-17 (×2): 100 ug via INTRAVENOUS
  Filled 2022-09-16 (×2): qty 2

## 2022-09-16 MED ORDER — ONDANSETRON HCL 4 MG/2ML IJ SOLN: 4.0000 mg | Freq: Four times a day (QID) | INTRAMUSCULAR | Status: DC | PRN

## 2022-09-16 MED ORDER — OXYTOCIN-SODIUM CHLORIDE 30-0.9 UT/500ML-% IV SOLN
1.0000 m[IU]/min | INTRAVENOUS | Status: DC
  Administered 2022-09-17: 2 m[IU]/min via INTRAVENOUS
  Filled 2022-09-16: qty 500

## 2022-09-16 MED ORDER — MISOPROSTOL 25 MCG QUARTER TABLET
25.0000 ug | ORAL_TABLET | ORAL | Status: AC
  Administered 2022-09-16 – 2022-09-17 (×2): 25 ug via VAGINAL
  Filled 2022-09-16 (×2): qty 1

## 2022-09-16 MED ORDER — OXYTOCIN BOLUS FROM INFUSION: 333.0000 mL | Freq: Once | INTRAVENOUS | Status: DC

## 2022-09-16 MED ORDER — OXYTOCIN-SODIUM CHLORIDE 30-0.9 UT/500ML-% IV SOLN: 2.5000 [IU]/h | INTRAVENOUS | Status: DC

## 2022-09-16 MED ORDER — OXYCODONE-ACETAMINOPHEN 5-325 MG PO TABS: 2.0000 | ORAL_TABLET | ORAL | Status: DC | PRN

## 2022-09-16 MED ORDER — MISOPROSTOL 25 MCG QUARTER TABLET: 25.0000 ug | ORAL_TABLET | ORAL | Status: DC

## 2022-09-16 MED ORDER — OXYCODONE-ACETAMINOPHEN 5-325 MG PO TABS: 1.0000 | ORAL_TABLET | ORAL | Status: DC | PRN

## 2022-09-16 MED ORDER — SODIUM CHLORIDE 0.9 % IV SOLN
5.0000 10*6.[IU] | Freq: Once | INTRAVENOUS | Status: AC
  Administered 2022-09-16: 5 10*6.[IU] via INTRAVENOUS
  Filled 2022-09-16: qty 5

## 2022-09-16 MED ORDER — HYDRALAZINE HCL 20 MG/ML IJ SOLN: 10.0000 mg | INTRAMUSCULAR | Status: DC | PRN

## 2022-09-16 MED ORDER — LACTATED RINGERS IV SOLN: INTRAVENOUS | Status: DC

## 2022-09-16 MED ORDER — LACTATED RINGERS IV SOLN: 500.0000 mL | INTRAVENOUS | Status: DC | PRN

## 2022-09-16 MED ORDER — FLEET ENEMA 7-19 GM/118ML RE ENEM: 1.0000 | ENEMA | RECTAL | Status: DC | PRN

## 2022-09-16 NOTE — MAU Provider Note (Signed)
History     CSN: [id number]  Arrival date and time: 09/16/22 1523   Event Date/Time   First Provider Initiated Contact with Patient 09/16/22 1619      Chief Complaint  Patient presents with   Labor Eval   HPI  Sandra Roach is a 22 y.o. G2P1001 at 69w0dwho presents for evaluation of possible labor. Patient reports she is contracting every 5 minutes all day. Upon arrival to MAU, patient found to be hypertensive. She denies any HTN in the pregnancy but reports she had postpartum preeclampsia with her first baby. She denies any HA, visual changes or epigastric pain.   She denies any vaginal bleeding, discharge, and leaking of fluid. Denies any constipation, diarrhea or any urinary complaints. Reports normal fetal movement.   OB History     Gravida  2   Para  1   Term  1   Preterm      AB      Living  1      SAB      IAB      Ectopic      Multiple      Live Births  1           Past Medical History:  Diagnosis Date   Asthma    Constipation     Past Surgical History:  Procedure Laterality Date   NO PAST SURGERIES      Family History  Problem Relation Age of Onset   Cancer Mother     Social History   Tobacco Use   Smoking status: Never   Smokeless tobacco: Never  Vaping Use   Vaping Use: Never used  Substance Use Topics   Alcohol use: Not Currently   Drug use: Not Currently    Types: Marijuana    Allergies: No Known Allergies  Medications Prior to Admission  Medication Sig Dispense Refill Last Dose   acetaminophen (TYLENOL) 325 MG tablet Take 650 mg by mouth every 6 (six) hours as needed.   Past Month   albuterol (VENTOLIN HFA) 108 (90 Base) MCG/ACT inhaler Inhale 2 puffs into the lungs every 6 (six) hours as needed for wheezing or shortness of breath. 1 each 0    benzonatate (TESSALON) 100 MG capsule Take 1 capsule (100 mg total) by mouth every 8 (eight) hours. (Patient not taking: Reported on 09/16/2022) 21 capsule 0 Not Taking    cetirizine (ZYRTEC ALLERGY) 10 MG tablet Take 1 tablet (10 mg total) by mouth daily. (Patient not taking: Reported on 09/16/2022) 30 tablet 0 Not Taking   menthol-cetylpyridinium (CEPACOL) 3 MG lozenge Take 1 lozenge (3 mg total) by mouth as needed for sore throat. (Patient not taking: Reported on 09/16/2022) 100 tablet 12 Not Taking   ondansetron (ZOFRAN-ODT) 4 MG disintegrating tablet Take 1 tablet (4 mg total) by mouth every 8 (eight) hours as needed for nausea or vomiting. (Patient not taking: Reported on 08/30/2022) 20 tablet 0    phenol (CHLORASEPTIC) 1.4 % LIQD Use as directed 1 spray in the mouth or throat as needed for throat irritation / pain. (Patient not taking: Reported on 09/16/2022) 177 mL 0 Not Taking    Review of Systems  Constitutional: Negative.  Negative for fatigue and fever.  HENT: Negative.    Respiratory: Negative.  Negative for shortness of breath.   Cardiovascular: Negative.  Negative for chest pain.  Gastrointestinal:  Positive for abdominal pain. Negative for constipation, diarrhea, nausea and vomiting.  Genitourinary: Negative.  Negative  for dysuria, vaginal bleeding and vaginal discharge.  Neurological: Negative.  Negative for dizziness and headaches.   Physical Exam   Blood pressure (!) 144/85, pulse 78, temperature 98.2 F (36.8 C), temperature source Oral, resp. rate 15, SpO2 100 %, unknown if currently breastfeeding.  Patient Vitals for the past 24 hrs:  BP Temp Temp src Pulse Resp SpO2  09/16/22 1910 -- 98.2 F (36.8 C) Oral -- -- 100 %  09/16/22 1817 (!) 144/85 -- -- 78 -- --  09/16/22 1802 (!) 145/90 -- -- 92 -- --  09/16/22 1747 (!) 140/81 -- -- 80 -- --  09/16/22 1732 (!) 142/92 -- -- 81 -- --  09/16/22 1717 (!) 142/88 -- -- 84 -- --  09/16/22 1702 (!) 143/82 -- -- 83 -- --  09/16/22 1647 (!) 145/88 -- -- 82 -- --  09/16/22 1632 (!) 149/95 -- -- 89 -- --  09/16/22 1631 (!) 149/95 -- -- 89 -- --  09/16/22 1617 (!) 151/93 -- -- 91 -- --  09/16/22 1600  (!) 146/97 -- -- 90 -- --  09/16/22 1557 (!) 144/88 -- -- 93 -- --  09/16/22 1543 (!) 150/86 98.8 F (37.1 C) -- 100 15 --    Physical Exam Vitals and nursing note reviewed.  Constitutional:      General: She is not in acute distress.    Appearance: She is well-developed.  HENT:     Head: Normocephalic.  Eyes:     Pupils: Pupils are equal, round, and reactive to light.  Cardiovascular:     Rate and Rhythm: Normal rate and regular rhythm.     Heart sounds: Normal heart sounds.  Pulmonary:     Effort: Pulmonary effort is normal. No respiratory distress.     Breath sounds: Normal breath sounds.  Abdominal:     General: Bowel sounds are normal. There is no distension.     Palpations: Abdomen is soft.     Tenderness: There is no abdominal tenderness.  Skin:    General: Skin is warm and dry.  Neurological:     Mental Status: She is alert and oriented to person, place, and time.  Psychiatric:        Mood and Affect: Mood normal.        Behavior: Behavior normal.        Thought Content: Thought content normal.        Judgment: Judgment normal.     Fetal Tracing:  Baseline: 125 Variability: moderate Accels: 15x15 Decels: none  Toco: 5-10  Dilation: 1.5 Effacement (%): Thick Cervical Position: Anterior Station: -3 Presentation: Vertex Exam by:: Elray Mcgregor, RN [phone]   MAU Course  Procedures  Results for orders placed or performed during the hospital encounter of 09/16/22 (from the past 24 hour(s))  Type and screen [place]     Status: None (Preliminary result)   Collection Time: 09/16/22  4:29 PM  Result Value Ref Range   ABO/RH(D) PENDING    Antibody Screen PENDING    Sample Expiration      09/19/2022,2359 Performed at [facility], [place] [address], [place] [address]   CBC     Status: Abnormal   Collection Time: 09/16/22  4:31 PM  Result Value Ref Range   WBC 8.7 4.0 - 10.5 K/uL   RBC 4.21 3.87 - 5.11 MIL/uL    Hemoglobin 11.2 (L) 12.0 - 15.0 g/dL   HCT 35.5 (L) 36.0 - 46.0 %  MCV 84.3 80.0 - 100.0 fL   MCH 26.6 26.0 - 34.0 pg   MCHC 31.5 30.0 - 36.0 g/dL   RDW 14.3 11.5 - 15.5 %   Platelets 197 150 - 400 K/uL   nRBC 0.0 0.0 - 0.2 %  Comprehensive metabolic panel     Status: Abnormal   Collection Time: 09/16/22  4:31 PM  Result Value Ref Range   Sodium 136 135 - 145 mmol/L   Potassium 3.6 3.5 - 5.1 mmol/L   Chloride 107 98 - 111 mmol/L   CO2 17 (L) 22 - 32 mmol/L   Glucose, Bld 94 70 - 99 mg/dL   BUN <5 (L) 6 - 20 mg/dL   Creatinine, Ser 0.65 0.44 - 1.00 mg/dL   Calcium 8.6 (L) 8.9 - 10.3 mg/dL   Total Protein 6.3 (L) 6.5 - 8.1 g/dL   Albumin 2.6 (L) 3.5 - 5.0 g/dL   AST 21 15 - 41 U/L   ALT 8 0 - 44 U/L   Alkaline Phosphatase 173 (H) 38 - 126 U/L   Total Bilirubin 0.6 0.3 - 1.2 mg/dL   GFR, Estimated >60 >60 mL/min   Anion gap 12 5 - 15  Protein / creatinine ratio, urine     Status: Abnormal   Collection Time: 09/16/22  4:42 PM  Result Value Ref Range   Creatinine, Urine 181 mg/dL   Total Protein, Urine 59 mg/dL   Protein Creatinine Ratio 0.33 (H) 0.00 - 0.15 mg/mg[Cre]    MDM Prenatal records from community office reviewed. Pregnancy uncomplicated. Labs ordered and reviewed.   UA CBC, CMP, Protein/creat ratio  Cervix unchanged on recheck  Dr. Alesia Richards notified of new onset HTN and labs indicating preeclampsia without severe features and recommendation for delivery- MD will come see patient for admission  Assessment and Plan  -Preeclampsia without severe features - [redacted] weeks gestation  -Admit to labor and delivery -Care turned over to MD  Wende Mott, CNM 09/16/2022, 4:19 PM

## 2022-09-16 NOTE — MAU Note (Signed)
.  Sandra Roach is a 22 y.o. at 54w0dhere in MAU reporting: ctx since yesterday. Now stronger and  closer . 1cm in office yesterday. Denies any vag bleeding or leaking. Good fetal movement felt.  LMP:  Onset of complaint: yesterday Pain score: 10 There were no vitals filed for this visit.   FHT:135 Lab orders placed from triage:  labor eval

## 2022-09-16 NOTE — Plan of Care (Signed)

## 2022-09-16 NOTE — H&P (Signed)
Sandra Roach is a 22 y.o. female presenting for induction of labor for preeclampsia without severe features. Patient presented to MAU today complaining of contractions. She ruled out for labor, bu was found to have elevated blood pressures and proteinuria.  G2P1001 at [redacted] weeks EGA, [facility] 09/30/22 by first trimester ultrasound.    Prenatal course significant for: Elevated 1 hr GCT, normal 3 hr GTT.   OB History     Gravida  2   Para  1   Term  1   Preterm      AB      Living  1      SAB      IAB      Ectopic      Multiple      Live Births  1          Past Medical History:  Diagnosis Date   Asthma    Constipation    Past Surgical History:  Procedure Laterality Date   NO PAST SURGERIES     Family History: family history includes Cancer in her mother. Social History:  reports that she has never smoked. She has never used smokeless tobacco. She reports that she does not currently use alcohol. She reports that she does not currently use drugs after having used the following drugs: Marijuana.     Maternal Diabetes: No Genetic Screening: Normal Maternal Ultrasounds/Referrals: Normal Fetal Ultrasounds or other Referrals:  None Maternal Substance Abuse:  No Significant Maternal Results:  Group B Strep positive Number of Prenatal Visits:greater than 3 verified prenatal visits Other Comments:  None  Review of Systems Constitutional: Denies fevers/chills Cardiovascular: Denies chest pain or palpitations Pulmonary: Denies coughing or wheezing Gastrointestinal: Denies nausea, vomiting or diarrhea Genitourinary: Denies pelvic pain, unusual vaginal bleeding, unusual vaginal discharge, dysuria, urgency or frequency. With contractions.  Musculoskeletal: Denies muscle or joint aches and pain.  Neurology: Denies abnormal sensations such as tingling or numbness.   History Dilation: 1.5 Effacement (%): Thick Station: -3 Exam by:: Elray Mcgregor, RN [phone] Blood pressure  (!) 149/100, pulse 83, temperature 98.2 F (36.8 C), temperature source Oral, resp. rate 18, SpO2 100 %, unknown if currently breastfeeding. NST: 120 baseline, moderate variability, reactive. TOCO: Irregular contractions every 1 to 3 minutes.  Exam Physical Exam  Constitutional: She is oriented to person, place, and time. She appears well-developed and well-nourished.  HENT:  Head: Normocephalic and atraumatic.  Neck: Normal range of motion.  Cardiovascular: Normal rate, regular rhythm and normal heart sounds.   Respiratory: Effort normal and breath sounds normal.  GI: Soft. Bowel sounds are normal. Genitorurinary: Gravid uterus, appropriate for gestational age.   EFW by [place] 8 lbs. With adequate pelvis.    Neurological: She is alert and oriented to person, place, and time.  Skin: Skin is warm and dry.  Psychiatric: She has a normal mood and affect. Her behavior is normal.    Prenatal labs: ABO, Rh: --/--/A NEG (03/05 1629) Antibody: POS (03/05 1629) Rubella:  Immune RPR:   NR HBsAg:   Neg HIV:   Neg GBS:   Positive CBC    Component Value Date/Time   WBC 8.7 09/16/2022 1631   RBC 4.21 09/16/2022 1631   HGB 11.2 (L) 09/16/2022 1631   HCT 35.5 (L) 09/16/2022 1631   PLT 197 09/16/2022 1631   MCV 84.3 09/16/2022 1631   MCH 26.6 09/16/2022 1631   MCHC 31.5 09/16/2022 1631   RDW 14.3 09/16/2022 1631   LYMPHSABS 0.9 08/09/2022 2044  MONOABS 0.6 08/09/2022 2044   EOSABS 0.1 08/09/2022 2044   BASOSABS 0.0 08/09/2022 2044    CMP     Component Value Date/Time   NA 136 09/16/2022 1631   K 3.6 09/16/2022 1631   CL 107 09/16/2022 1631   CO2 17 (L) 09/16/2022 1631   GLUCOSE 94 09/16/2022 1631   BUN <5 (L) 09/16/2022 1631   CREATININE 0.65 09/16/2022 1631   CALCIUM 8.6 (L) 09/16/2022 1631   PROT 6.3 (L) 09/16/2022 1631   ALBUMIN 2.6 (L) 09/16/2022 1631   AST 21 09/16/2022 1631   ALT 8 09/16/2022 1631   ALKPHOS 173 (H) 09/16/2022 1631   BILITOT 0.6 09/16/2022 1631    GFRNONAA >60 09/16/2022 1631   GFRAA >60 01/29/2020 1842         Component Ref Range & Units 16:42  Creatinine, Urine mg/dL 181  Total Protein, Urine mg/dL 59  Comment: NO NORMAL RANGE ESTABLISHED FOR THIS TEST  Protein Creatinine Ratio 0.00 - 0.15 mg/mgCre 0.33 High      Assessment/Plan: 22 y/o G2P1001 at [redacted] weeks EGA, being induced for preeclampsia without severe features,  - Admit to Labor and Delivery as per admit orders. - I discussed with patient risks, benefits and alternatives of labor induction including risks of cesarean delivery.  We discussed risks of induction agents including effects on fetal heart beat, contraction pattern and need for close monitoring.  Patient expressed understanding of all this and desired to proceed with the induction.  - Plan for vaginal cytotec start when contractions space out.  - Penicillin for GBS prophylaxis.     Archie Endo, MD.  09/16/2022, 8:08 PM

## 2022-09-17 ENCOUNTER — Inpatient Hospital Stay (HOSPITAL_COMMUNITY): Payer: Medicaid Other | Admitting: Anesthesiology

## 2022-09-17 ENCOUNTER — Encounter (HOSPITAL_COMMUNITY): Payer: Self-pay | Admitting: Obstetrics & Gynecology

## 2022-09-17 LAB — CBC
HCT: 33.2 % — ABNORMAL LOW (ref 36.0–46.0)
Hemoglobin: 10.6 g/dL — ABNORMAL LOW (ref 12.0–15.0)
MCH: 26.9 pg (ref 26.0–34.0)
MCHC: 31.9 g/dL (ref 30.0–36.0)
MCV: 84.3 fL (ref 80.0–100.0)
Platelets: 203 10*3/uL (ref 150–400)
RBC: 3.94 MIL/uL (ref 3.87–5.11)
RDW: 14.5 % (ref 11.5–15.5)
WBC: 10.7 10*3/uL — ABNORMAL HIGH (ref 4.0–10.5)
nRBC: 0 % (ref 0.0–0.2)

## 2022-09-17 LAB — RPR: RPR Ser Ql: NONREACTIVE

## 2022-09-17 MED ORDER — ACETAMINOPHEN 325 MG PO TABS
650.0000 mg | ORAL_TABLET | ORAL | Status: DC | PRN
  Administered 2022-09-18 (×3): 650 mg via ORAL
  Filled 2022-09-17 (×3): qty 2

## 2022-09-17 MED ORDER — LACTATED RINGERS IV SOLN: INTRAVENOUS | Status: DC

## 2022-09-17 MED ORDER — FENTANYL-BUPIVACAINE-NACL 0.5-0.125-0.9 MG/250ML-% EP SOLN
12.0000 mL/h | EPIDURAL | Status: DC | PRN
  Administered 2022-09-17: 12 mL/h via EPIDURAL
  Filled 2022-09-17: qty 250

## 2022-09-17 MED ORDER — WITCH HAZEL-GLYCERIN EX PADS: 1.0000 | MEDICATED_PAD | CUTANEOUS | Status: DC | PRN

## 2022-09-17 MED ORDER — DIPHENHYDRAMINE HCL 50 MG/ML IJ SOLN: 12.5000 mg | INTRAMUSCULAR | Status: DC | PRN

## 2022-09-17 MED ORDER — DIPHENHYDRAMINE HCL 25 MG PO CAPS: 25.0000 mg | ORAL_CAPSULE | Freq: Four times a day (QID) | ORAL | Status: DC | PRN

## 2022-09-17 MED ORDER — ONDANSETRON HCL 4 MG PO TABS: 4.0000 mg | ORAL_TABLET | ORAL | Status: DC | PRN

## 2022-09-17 MED ORDER — MAGNESIUM SULFATE BOLUS VIA INFUSION
4.0000 g | Freq: Once | INTRAVENOUS | Status: AC
  Administered 2022-09-17: 4 g via INTRAVENOUS
  Filled 2022-09-17: qty 1000

## 2022-09-17 MED ORDER — BENZOCAINE-MENTHOL 20-0.5 % EX AERO
1.0000 | INHALATION_SPRAY | CUTANEOUS | Status: DC | PRN
  Administered 2022-09-18: 1 via TOPICAL
  Filled 2022-09-17: qty 56

## 2022-09-17 MED ORDER — IBUPROFEN 600 MG PO TABS
600.0000 mg | ORAL_TABLET | Freq: Four times a day (QID) | ORAL | Status: DC
  Administered 2022-09-17 – 2022-09-20 (×11): 600 mg via ORAL
  Filled 2022-09-17 (×11): qty 1

## 2022-09-17 MED ORDER — SENNOSIDES-DOCUSATE SODIUM 8.6-50 MG PO TABS
2.0000 | ORAL_TABLET | Freq: Every day | ORAL | Status: DC
  Administered 2022-09-18 – 2022-09-19 (×2): 2 via ORAL
  Filled 2022-09-17 (×3): qty 2

## 2022-09-17 MED ORDER — RHO D IMMUNE GLOBULIN 1500 UNIT/2ML IJ SOSY
300.0000 ug | PREFILLED_SYRINGE | Freq: Once | INTRAMUSCULAR | Status: AC
  Administered 2022-09-18: 300 ug via INTRAVENOUS
  Filled 2022-09-17: qty 2

## 2022-09-17 MED ORDER — PHENYLEPHRINE 80 MCG/ML (10ML) SYRINGE FOR IV PUSH (FOR BLOOD PRESSURE SUPPORT): 80.0000 ug | PREFILLED_SYRINGE | INTRAVENOUS | Status: DC | PRN

## 2022-09-17 MED ORDER — LACTATED RINGERS IV SOLN
500.0000 mL | Freq: Once | INTRAVENOUS | Status: AC
  Administered 2022-09-17: 500 mL via INTRAVENOUS

## 2022-09-17 MED ORDER — EPHEDRINE 5 MG/ML INJ: 10.0000 mg | INTRAVENOUS | Status: DC | PRN

## 2022-09-17 MED ORDER — ZOLPIDEM TARTRATE 5 MG PO TABS: 5.0000 mg | ORAL_TABLET | Freq: Every evening | ORAL | Status: DC | PRN

## 2022-09-17 MED ORDER — LORATADINE 10 MG PO TABS
10.0000 mg | ORAL_TABLET | Freq: Every day | ORAL | Status: DC
  Administered 2022-09-18 – 2022-09-20 (×3): 10 mg via ORAL
  Filled 2022-09-17 (×4): qty 1

## 2022-09-17 MED ORDER — DIBUCAINE (PERIANAL) 1 % EX OINT: 1.0000 | TOPICAL_OINTMENT | CUTANEOUS | Status: DC | PRN

## 2022-09-17 MED ORDER — MAGNESIUM SULFATE 40 GM/1000ML IV SOLN
2.0000 g/h | INTRAVENOUS | Status: AC
  Administered 2022-09-18: 2 g/h via INTRAVENOUS
  Filled 2022-09-17 (×2): qty 1000

## 2022-09-17 MED ORDER — LIDOCAINE HCL (PF) 1 % IJ SOLN
INTRAMUSCULAR | Status: DC | PRN
  Administered 2022-09-17 (×2): 5 mL via EPIDURAL

## 2022-09-17 MED ORDER — ONDANSETRON HCL 4 MG/2ML IJ SOLN: 4.0000 mg | INTRAMUSCULAR | Status: DC | PRN

## 2022-09-17 MED ORDER — SIMETHICONE 80 MG PO CHEW: 80.0000 mg | CHEWABLE_TABLET | ORAL | Status: DC | PRN

## 2022-09-17 MED ORDER — PRENATAL MULTIVITAMIN CH
1.0000 | ORAL_TABLET | Freq: Every day | ORAL | Status: DC
  Administered 2022-09-18 – 2022-09-20 (×3): 1 via ORAL
  Filled 2022-09-17 (×3): qty 1

## 2022-09-17 MED ORDER — COCONUT OIL OIL: 1.0000 | TOPICAL_OIL | Status: DC | PRN

## 2022-09-17 MED ORDER — ALBUTEROL SULFATE HFA 108 (90 BASE) MCG/ACT IN AERS: 2.0000 | INHALATION_SPRAY | Freq: Four times a day (QID) | RESPIRATORY_TRACT | Status: DC | PRN

## 2022-09-17 NOTE — Progress Notes (Signed)
Patient transported to room 102 via wheelchair with infant in arms. Magnesium Sufate infusing at 8m/hr and maintenance fluid LR '@10ml'$ /hr both lines infusing via infusion pump with lines labeled. IV site without erythema/edema and patent. SCDs remain in place. Falls bracelet remains in place. Handoff conducted with receiving nurse. Fundus remains firm @-1 w/small amount of lochia present. No issues or concerns at this time.

## 2022-09-17 NOTE — Anesthesia Procedure Notes (Signed)
Epidural Patient location during procedure: OB Start time: 09/17/2022 12:03 PM End time: 09/17/2022 12:13 PM  Staffing Anesthesiologist: Josephine Igo, MD Performed: anesthesiologist   Preanesthetic Checklist Completed: patient identified, IV checked, site marked, risks and benefits discussed, surgical consent, monitors and equipment checked, pre-op evaluation and timeout performed  Epidural Patient position: sitting Prep: DuraPrep and site prepped and draped Patient monitoring: continuous pulse ox and blood pressure Approach: midline Location: L3-L4 Injection technique: LOR air  Needle:  Needle type: Tuohy  Needle gauge: 17 G Needle length: 9 cm and 9 Needle insertion depth: 5 cm Catheter type: closed end flexible Catheter size: 19 Gauge Catheter at skin depth: 10 cm Test dose: negative and Other  Assessment Events: blood not aspirated, no cerebrospinal fluid, injection not painful, no injection resistance, no paresthesia and negative IV test  Additional Notes Patient identified. Risks and benefits discussed including failed block, incomplete  Pain control, post dural puncture headache, nerve damage, paralysis, blood pressure Changes, nausea, vomiting, reactions to medications-both toxic and allergic and post Partum back pain. All questions were answered. Patient expressed understanding and wished to proceed. Sterile technique was used throughout procedure. Epidural site was Dressed with sterile barrier dressing. No paresthesias, signs of intravascular injection Or signs of intrathecal spread were encountered.  Patient was more comfortable after the epidural was dosed. Please see RN's note for documentation of vital signs and FHR which are stable. Reason for block:procedure for pain

## 2022-09-17 NOTE — Progress Notes (Signed)
Report called to receiving nurse

## 2022-09-17 NOTE — Progress Notes (Signed)
Handoff received from Camera operator.

## 2022-09-17 NOTE — Progress Notes (Signed)
Pt just had an epidural BP (!) 142/84   Pulse 84   Temp 98.1 F (36.7 C) (Oral)   Resp 18   Ht '5\' 5"'$  (1.651 m)   Wt 104.3 kg   SpO2 100%   BMI 38.27 kg/m  Cat 1 Toco q 1-2 min on 6 mu/min of pitocin 4-5/90/0 PCNG for GBS prophylaxsis Anticipate SVD

## 2022-09-17 NOTE — Anesthesia Preprocedure Evaluation (Signed)
Anesthesia Evaluation  Patient identified by MRN, date of birth, ID band Patient awake    Reviewed: Allergy & Precautions, Patient's Chart, lab work & pertinent test results  Airway Mallampati: II       Dental no notable dental hx.    Pulmonary asthma    Pulmonary exam normal        Cardiovascular hypertension, Normal cardiovascular exam Rhythm:Regular     Neuro/Psych negative neurological ROS  negative psych ROS   GI/Hepatic negative GI ROS, Neg liver ROS,,,  Endo/Other  Obesity  Renal/GU negative Renal ROS  negative genitourinary   Musculoskeletal negative musculoskeletal ROS (+)    Abdominal  (+) + obese  Peds  Hematology  (+) Blood dyscrasia, anemia   Anesthesia Other Findings   Reproductive/Obstetrics (+) Pregnancy                             Anesthesia Physical Anesthesia Plan  ASA: 2  Anesthesia Plan: Epidural   Post-op Pain Management:    Induction:   PONV Risk Score and Plan:   Airway Management Planned: Natural Airway  Additional Equipment:   Intra-op Plan:   Post-operative Plan:   Informed Consent: I have reviewed the patients History and Physical, chart, labs and discussed the procedure including the risks, benefits and alternatives for the proposed anesthesia with the patient or authorized representative who has indicated his/her understanding and acceptance.       Plan Discussed with: Anesthesiologist  Anesthesia Plan Comments:         Anesthesia Quick Evaluation

## 2022-09-17 NOTE — Plan of Care (Signed)
  Problem: Education: Goal: Knowledge of Childbirth will improve Outcome: Progressing Goal: Ability to make informed decisions regarding treatment and plan of care will improve Outcome: Progressing Goal: Ability to state and carry out methods to decrease the pain will improve Outcome: Progressing Goal: Individualized Educational Video(s) Outcome: Progressing   Problem: Coping: Goal: Ability to verbalize concerns and feelings about labor and delivery will improve Outcome: Progressing   Problem: Life Cycle: Goal: Ability to make normal progression through stages of labor will improve Outcome: Progressing Goal: Ability to effectively push during vaginal delivery will improve Outcome: Progressing   Problem: Role Relationship: Goal: Will demonstrate positive interactions with the child Outcome: Progressing   Problem: Safety: Goal: Risk of complications during labor and delivery will decrease Outcome: Progressing   Problem: Pain Management: Goal: Relief or control of pain from uterine contractions will improve Outcome: Progressing   Problem: Education: Goal: Knowledge of General Education information will improve Description: Including pain rating scale, medication(s)/side effects and non-pharmacologic comfort measures Outcome: Progressing   Problem: Health Behavior/Discharge Planning: Goal: Ability to manage health-related needs will improve Outcome: Progressing   Problem: Clinical Measurements: Goal: Ability to maintain clinical measurements within normal limits will improve Outcome: Progressing Goal: Will remain free from infection Outcome: Progressing Goal: Diagnostic test results will improve Outcome: Progressing Goal: Respiratory complications will improve Outcome: Progressing Goal: Cardiovascular complication will be avoided Outcome: Progressing   Problem: Activity: Goal: Risk for activity intolerance will decrease Outcome: Progressing   Problem:  Nutrition: Goal: Adequate nutrition will be maintained Outcome: Progressing   Problem: Coping: Goal: Level of anxiety will decrease Outcome: Progressing   Problem: Elimination: Goal: Will not experience complications related to bowel motility Outcome: Progressing Goal: Will not experience complications related to urinary retention Outcome: Progressing   Problem: Pain Managment: Goal: General experience of comfort will improve Outcome: Progressing   Problem: Safety: Goal: Ability to remain free from injury will improve Outcome: Progressing   Problem: Skin Integrity: Goal: Risk for impaired skin integrity will decrease Outcome: Progressing   Problem: Education: Goal: Knowledge of disease or condition will improve Outcome: Progressing Goal: Knowledge of the prescribed therapeutic regimen will improve Outcome: Progressing   Problem: Fluid Volume: Goal: Peripheral tissue perfusion will improve Outcome: Progressing   Problem: Clinical Measurements: Goal: Complications related to disease process, condition or treatment will be avoided or minimized Outcome: Progressing

## 2022-09-17 NOTE — Progress Notes (Addendum)
Magnesium Sulfate 4 gm bolus administered via infusion pump. Line labeled. Maintenance line of LR '@10ml'$ /hr. SCDs in place. e. Dual sign off with Darrow Bussing RN.

## 2022-09-18 LAB — COMPREHENSIVE METABOLIC PANEL
ALT: 9 U/L (ref 0–44)
AST: 23 U/L (ref 15–41)
Albumin: 2.3 g/dL — ABNORMAL LOW (ref 3.5–5.0)
Alkaline Phosphatase: 146 U/L — ABNORMAL HIGH (ref 38–126)
Anion gap: 3 — ABNORMAL LOW (ref 5–15)
BUN: <5 mg/dL — ABNORMAL LOW (ref 6–20)
CO2: 26 mmol/L (ref 22–32)
Calcium: 7.8 mg/dL — ABNORMAL LOW (ref 8.9–10.3)
Chloride: 105 mmol/L (ref 98–111)
Creatinine, Ser: 0.67 mg/dL (ref 0.44–1.00)
GFR, Estimated: >60 mL/min (ref >60)
Glucose, Bld: 90 mg/dL (ref 70–99)
Potassium: 4 mmol/L (ref 3.5–5.1)
Sodium: 134 mmol/L — ABNORMAL LOW (ref 135–145)
Total Bilirubin: 0.4 mg/dL (ref 0.3–1.2)
Total Protein: 5.8 g/dL — ABNORMAL LOW (ref 6.5–8.1)

## 2022-09-18 LAB — CBC
HCT: 30.3 % — ABNORMAL LOW (ref 36.0–46.0)
Hemoglobin: 9.9 g/dL — ABNORMAL LOW (ref 12.0–15.0)
MCH: 27 pg (ref 26.0–34.0)
MCHC: 32.7 g/dL (ref 30.0–36.0)
MCV: 82.8 fL (ref 80.0–100.0)
Platelets: 197 10*3/uL (ref 150–400)
RBC: 3.66 MIL/uL — ABNORMAL LOW (ref 3.87–5.11)
RDW: 14.5 % (ref 11.5–15.5)
WBC: 9.7 10*3/uL (ref 4.0–10.5)
nRBC: 0 % (ref 0.0–0.2)

## 2022-09-18 LAB — MAGNESIUM: Magnesium: 4.7 mg/dL — ABNORMAL HIGH (ref 1.7–2.4)

## 2022-09-18 NOTE — Lactation Note (Signed)
This note was copied from a baby's chart. Lactation Consultation Note  Patient Name: Boy Keyera Laplant [id number] Date: 09/18/2022 Age:23 hours Reason for consult: Initial assessment;Early term 37-38.6wks  Initial visit to 83 hours old infant. Demonstrated hand expression, observed drops of colostrum. DEBP had been set up but birthing parent has only used once. Reviewed DEBP and manual pump use and care, in addition to milk storage. Fitted 21-mm flange. Infant is too sleepy and uninterested to latch. Left him skin to skin with birthing parent. Parent does not own a pump, maybe would qualify for [facility] pump.  Discussed normal newborn behavior and patterns, signs of good milk transfer, hunger cues, tummy size and benefits of skin to skin. Provided volume according to age for bottlefeeding.   Plan: 1-Feeding on demand or 8-12 times in 24h period. 2-Use pump as needed 3-Encouraged birthing parent rest, hydration and food intake.   Contact LC as needed for feeds/support/concerns/questions. All questions answered at this time. Provided Lactation services brochure.   Maternal Data Has patient been taught Hand Expression?: Yes Does the patient have breastfeeding experience prior to this delivery?: Yes How long did the patient breastfeed?: <1 week  Feeding Mother's Current Feeding Choice: Breast Milk and Formula  LATCH Score Latch: Too sleepy or reluctant, no latch achieved, no sucking elicited.  Audible Swallowing: None  Type of Nipple: Everted at rest and after stimulation  Comfort (Breast/Nipple): Soft / non-tender  Hold (Positioning): Assistance needed to correctly position infant at breast and maintain latch.  LATCH Score: 5   Lactation Tools Discussed/Used Tools: Pump;Flanges Flange Size: 21 Breast pump type: Double-Electric Breast Pump;Manual Pump Education: Setup, frequency, and cleaning;Milk Storage Reason for Pumping: stimulation and supplementation Pumping frequency: at  least 8 times in 24h  Interventions Interventions: Breast feeding basics reviewed;Skin to skin;Breast massage;Hand express;Expressed milk;Position options;Support pillows;DEBP;Education;LC Services brochure;Hand pump;Assisted with latch  Discharge Pump: Manual;DEBP WIC Program: Yes  Consult Status Consult Status: Follow-up Date: 09/19/22 Follow-up type: In-patient    Nisreen Guise A Higuera Ancidey 09/18/2022, 3:07 PM

## 2022-09-18 NOTE — Anesthesia Postprocedure Evaluation (Signed)
Anesthesia Post Note  Patient: Sandra Roach  Procedure(s) Performed: AN AD HOC LABOR EPIDURAL     Patient location during evaluation: OB High Risk Anesthesia Type: Epidural Level of consciousness: awake, oriented and awake and alert Pain management: pain level controlled Vital Signs Assessment: post-procedure vital signs reviewed and stable Respiratory status: spontaneous breathing, respiratory function stable and nonlabored ventilation Cardiovascular status: stable Postop Assessment: adequate PO intake, able to ambulate, patient able to bend at knees, no apparent nausea or vomiting and no headache Anesthetic complications: no   No notable events documented.  Last Vitals:  Vitals:   09/18/22 0659 09/18/22 0813  BP:  126/80  Pulse:  82  Resp: 16 17  Temp:  36.6 C  SpO2:  99%    Last Pain:  Vitals:   09/18/22 0813  TempSrc: Oral  PainSc: 0-No pain   Pain Goal:                   Carnelius Hammitt

## 2022-09-18 NOTE — Progress Notes (Signed)
Consult received to place PIV. Arrived to patient room. Patient unavailable at this time for PIV placement. Notified via secure chat, nurse to re-consult when patient ready for PIV assessment/placement. Vu.Ajane Novella Gayleen Orem, RN VAST

## 2022-09-18 NOTE — Progress Notes (Addendum)
Post Partum Day 1 Subjective: Feels tired, up ad lib, voiding, and tolerating PO.  With light vaginal bleeding.   Objective: Blood pressure 113/67, pulse 96, temperature 98 F (36.7 C), temperature source Oral, resp. rate 18, height '5\' 5"'$  (1.651 m), weight 97.7 kg, SpO2 98 %, unknown if currently breastfeeding.    09/18/2022   12:05 PM 09/18/2022    8:13 AM 09/18/2022    4:56 AM  Vitals with BMI  Weight   215 lbs 6 oz  BMI   99991111  Systolic 123456 [id number]   Diastolic 67 80   Pulse 96 82    I/O last 3 completed shifts: In: 2274.2 [P.O.:660; I.V.:1614.2] Out: [id number] [Urine:4150; Blood:200]  Physical Exam:  General: no distress Lochia: appropriate Uterine Fundus: firm Incision: N/A DVT Evaluation: No evidence of DVT seen on physical exam. Calf/Ankle edema is present, 1+ edema bilaterally. 1+ patellar reflex bilaterally.  CVS: s1, S2, RRR Pulmonary: Clear to auscultation bilaterally Recent Labs    09/17/22 0859 09/18/22 0224  HGB 10.6* 9.9*  HCT 33.2* 30.3*  CBC    Component Value Date/Time   WBC 9.7 09/18/2022 0224   RBC 3.66 (L) 09/18/2022 0224   HGB 9.9 (L) 09/18/2022 0224   HCT 30.3 (L) 09/18/2022 0224   PLT 197 09/18/2022 0224   MCV 82.8 09/18/2022 0224   MCH 27.0 09/18/2022 0224   MCHC 32.7 09/18/2022 0224   RDW 14.5 09/18/2022 0224   LYMPHSABS 0.9 08/09/2022 2044   MONOABS 0.6 08/09/2022 2044   EOSABS 0.1 08/09/2022 2044   BASOSABS 0.0 08/09/2022 2044    CMP     Component Value Date/Time   NA 134 (L) 09/18/2022 0224   K 4.0 09/18/2022 0224   CL 105 09/18/2022 0224   CO2 26 09/18/2022 0224   GLUCOSE 90 09/18/2022 0224   BUN <5 (L) 09/18/2022 0224   CREATININE 0.67 09/18/2022 0224   CALCIUM 7.8 (L) 09/18/2022 0224   PROT 5.8 (L) 09/18/2022 0224   ALBUMIN 2.3 (L) 09/18/2022 0224   AST 23 09/18/2022 0224   ALT 9 09/18/2022 0224   ALKPHOS 146 (H) 09/18/2022 0224   BILITOT 0.4 09/18/2022 0224   GFRNONAA >60 09/18/2022 0224   GFRAA >60 01/29/2020 1842      Assessment/Plan: 22 y/o G2P2002 PPD # 1, s/p vaginal delivery after induction of labor for Preeclampsia with severe features, on postpartum magnesium sulfate, stable, Plan for discharge tomorrow Iron tabs for anemia. Plan to stop magnesium sulfate at 2 pm today and continue with close BP follow up.  - Patient desires circumcision of her baby.  -I discussed with patient risks, benefits and alternatives of circumcision including risks of bleeding, infection, damage to organs and need for further procedures.  All her questions were answered and she consented for the procedure to be done on her son.   LOS: 2 days   Archie Endo, MD 09/18/2022, 12:51 PM

## 2022-09-19 LAB — RH IG WORKUP (INCLUDES ABO/RH)
Fetal Screen: NEGATIVE
Gestational Age(Wks): 38
Unit division: 0

## 2022-09-19 LAB — SURGICAL PATHOLOGY

## 2022-09-19 MED ORDER — NIFEDIPINE ER OSMOTIC RELEASE 30 MG PO TB24
30.0000 mg | ORAL_TABLET | Freq: Every day | ORAL | Status: DC
  Administered 2022-09-19 – 2022-09-20 (×2): 30 mg via ORAL
  Filled 2022-09-19 (×2): qty 1

## 2022-09-19 NOTE — Progress Notes (Signed)
Post Partum Day 2 Subjective: no complaints, up ad lib, voiding, tolerating PO, and + flatus  Objective: Blood pressure (!) 149/74, pulse 65, temperature 98.3 F (36.8 C), temperature source Oral, resp. rate 16, height '5\' 5"'$  (1.651 m), weight 97.2 kg, SpO2 99 %, unknown if currently breastfeeding.  Physical Exam:  General: alert, cooperative, and no distress Lochia: appropriate Uterine Fundus: firm Incision: n/a DVT Evaluation: No evidence of DVT seen on physical exam.  Recent Labs    09/17/22 0859 09/18/22 0224  HGB 10.6* 9.9*  HCT 33.2* 30.3*    Assessment/Plan: 22 y/o G2P2002 PPD # 2, s/p vaginal delivery after induction of labor for Preeclampsia with severe features  S/p 24hr PP MgSO4  PIH labs wnl, BP 140s/70s-90s overnight, started on procardia 30 mg XL qd today. Denies s/sx. Continue to follow BP today and plan for discharge tomorrow.  Plan for discharge tomorrow, breast and bottle feeding, s/p circumcision for female infant   Infant under phototherapy for hyperbilirubinemia    LOS: 3 days   Sandra Class, MD 09/19/2022, 9:07 AM

## 2022-09-19 NOTE — Lactation Note (Signed)
This note was copied from a baby's chart. Lactation Consultation Note  Patient Name: Sandra Roach [id number] Date: 09/19/2022 Age:23 hours Reason for consult: Follow-up assessment;Early term 37-38.6wks;Breastfeeding assistance;Infant weight loss (4.06% WL)  LC entered the room and the infant was in the bassinet under the bili lights.  Per the birth parent, the infant has not been interested in latching.  The birth parent stated that she has been pumping and she has started to see an increase in her production.  LC praised the birth parent for her efforts.  LC encouraged the birth parent to continue pumping every 3 hours.  The birth parent stated that she had no further questions or concerns.  [place] left her name on the board and exited the room.    Interventions Interventions: Education   Consult Status Consult Status: Follow-up Date: 09/20/22 Follow-up type: In-patient   Lysbeth Penner 09/19/2022, 3:57 PM

## 2022-09-20 DIAGNOSIS — D509 Iron deficiency anemia, unspecified: Secondary | ICD-10-CM | POA: Insufficient documentation

## 2022-09-20 MED ORDER — ACETAMINOPHEN 325 MG PO TABS: 650.0000 mg | ORAL_TABLET | ORAL | Status: AC | PRN

## 2022-09-20 MED ORDER — NIFEDIPINE ER 30 MG PO TB24: 30.0000 mg | ORAL_TABLET | Freq: Every day | ORAL | 0 refills | Status: AC

## 2022-09-20 MED ORDER — IBUPROFEN 600 MG PO TABS: 600.0000 mg | ORAL_TABLET | Freq: Four times a day (QID) | ORAL | 0 refills | Status: AC

## 2022-09-20 NOTE — Plan of Care (Signed)
  Problem: Education: Goal: Knowledge of Childbirth will improve Outcome: Completed/Met Goal: Ability to make informed decisions regarding treatment and plan of care will improve Outcome: Completed/Met Goal: Ability to state and carry out methods to decrease the pain will improve Outcome: Completed/Met Goal: Individualized Educational Video(s) Outcome: Completed/Met   Problem: Coping: Goal: Ability to verbalize concerns and feelings about labor and delivery will improve Outcome: Completed/Met   Problem: Life Cycle: Goal: Ability to make normal progression through stages of labor will improve Outcome: Completed/Met Goal: Ability to effectively push during vaginal delivery will improve Outcome: Completed/Met   Problem: Role Relationship: Goal: Will demonstrate positive interactions with the child Outcome: Completed/Met   Problem: Safety: Goal: Risk of complications during labor and delivery will decrease Outcome: Completed/Met   Problem: Pain Management: Goal: Relief or control of pain from uterine contractions will improve Outcome: Completed/Met   Problem: Education: Goal: Knowledge of General Education information will improve Description: Including pain rating scale, medication(s)/side effects and non-pharmacologic comfort measures Outcome: Completed/Met   Problem: Health Behavior/Discharge Planning: Goal: Ability to manage health-related needs will improve Outcome: Completed/Met   Problem: Clinical Measurements: Goal: Ability to maintain clinical measurements within normal limits will improve Outcome: Completed/Met Goal: Will remain free from infection Outcome: Completed/Met Goal: Diagnostic test results will improve Outcome: Completed/Met Goal: Respiratory complications will improve Outcome: Completed/Met Goal: Cardiovascular complication will be avoided Outcome: Completed/Met   Problem: Activity: Goal: Risk for activity intolerance will decrease Outcome:  Completed/Met   Problem: Nutrition: Goal: Adequate nutrition will be maintained Outcome: Completed/Met   Problem: Coping: Goal: Level of anxiety will decrease Outcome: Completed/Met   Problem: Elimination: Goal: Will not experience complications related to bowel motility Outcome: Completed/Met Goal: Will not experience complications related to urinary retention Outcome: Completed/Met   Problem: Pain Managment: Goal: General experience of comfort will improve Outcome: Completed/Met   Problem: Safety: Goal: Ability to remain free from injury will improve Outcome: Completed/Met   Problem: Skin Integrity: Goal: Risk for impaired skin integrity will decrease Outcome: Completed/Met   Problem: Education: Goal: Knowledge of disease or condition will improve Outcome: Completed/Met Goal: Knowledge of the prescribed therapeutic regimen will improve Outcome: Completed/Met   Problem: Fluid Volume: Goal: Peripheral tissue perfusion will improve Outcome: Completed/Met   Problem: Clinical Measurements: Goal: Complications related to disease process, condition or treatment will be avoided or minimized Outcome: Completed/Met   Problem: Education: Goal: Knowledge of condition will improve Outcome: Completed/Met Goal: Individualized Educational Video(s) Outcome: Completed/Met Goal: Individualized Newborn Educational Video(s) Outcome: Completed/Met   Problem: Activity: Goal: Will verbalize the importance of balancing activity with adequate rest periods Outcome: Completed/Met Goal: Ability to tolerate increased activity will improve Outcome: Completed/Met   Problem: Coping: Goal: Ability to identify and utilize available resources and services will improve Outcome: Completed/Met   Problem: Life Cycle: Goal: Chance of risk for complications during the postpartum period will decrease Outcome: Completed/Met   Problem: Role Relationship: Goal: Ability to demonstrate positive  interaction with newborn will improve Outcome: Completed/Met   Problem: Skin Integrity: Goal: Demonstration of wound healing without infection will improve Outcome: Completed/Met   

## 2022-09-20 NOTE — Lactation Note (Addendum)
This note was copied from a baby's chart. Lactation Consultation Note  Patient Name: Sandra Roach [id number] Date: 09/20/2022 Age:23 hours Reason for consult: Follow-up assessment (LC attempted to see  mom and she was sound asleep. Per [facility] RN , D/C for baby is pending a 2pm Bilirubin) Per [facility] RN mom has been pumping and obtaining milk  [facility] RN aware to call [place] when she is awake.   Maternal Data    Feeding Mother's Current Feeding Choice: Breast Milk and Formula  LATCH Score   Consult Status Consult Status: Follow-up Date: 09/20/22 Follow-up type: In-patient    [place] 09/20/2022, 12:36 PM

## 2022-09-20 NOTE — Lactation Note (Signed)
This note was copied from a baby's chart. Lactation Consultation Note  Patient Name: Sandra Roach [id number] Date: 09/20/2022 Age:23 hours - Serum Bi;I at 1402 = 13.8  Reason for consult: Follow-up assessment;Hyperbilirubinemia;Infant weight loss;Early term 37-38.6wks;Breastfeeding assistance (2 % weight loss, milk is in bilaterally and per mom last pumped off 74 ml) Baby awake , rooting after diaper change .  LC offered to assist and baby latched with depth and fed 7 mins with multiple swallows and increased with breast compressions. Per mom comfortable. Latch score 8 .  [facility] RN mentioned baby is a potential D/C , LC reviewed BF D/C teaching for latching and for just pumping, LC resources and LC offered to request and Lc O/P and mom receptive.     Maternal Data Has patient been taught Hand Expression?: Yes  Feeding Mother's Current Feeding Choice: Breast Milk  LATCH Score Latch: Grasps breast easily, tongue down, lips flanged, rhythmical sucking.  Audible Swallowing: Spontaneous and intermittent  Type of Nipple: Everted at rest and after stimulation ([place] compresses well)  Comfort (Breast/Nipple): Filling, red/small blisters or bruises, mild/mod discomfort  Hold (Positioning): Assistance needed to correctly position infant at breast and maintain latch.  LATCH Score: 8   Lactation Tools Discussed/Used Tools: Pump;Flanges Flange Size: 21 Breast pump type: Double-Electric Breast Pump;Manual Pump Education: Milk Storage  Interventions Interventions: Breast feeding basics reviewed;Assisted with latch;Skin to skin;Breast massage;Hand express;Reverse pressure;Breast compression;Adjust position;Support pillows;Position options;Hand pump;DEBP;Education;LC Services brochure  Discharge Discharge Education: Engorgement and breast care;Warning signs for feeding baby;Outpatient recommendation;Outpatient Epic message sent;Other (comment) (mom aware she will receive a call to set up  the appt) Pump: Manual;DEBP;Personal  Consult Status Consult Status: Complete Date: 09/20/22 Follow-up type: In-patient    [place] 09/20/2022, 3:21 PM

## 2022-09-20 NOTE — Discharge Summary (Signed)
Postpartum Discharge Summary  Date of Service updated 09/20/22    Patient Name: Sandra Roach DOB: 04-05-2001 MRN: [id number]  Date of admission: 09/16/2022 Delivery date:09/17/2022  Delivering provider: Crawford Givens  Date of discharge: 09/20/2022  Admitting diagnosis: Preeclampsia [O14.90] Intrauterine pregnancy: [redacted]w[redacted]d    Secondary diagnosis:  Principal Problem:   Preeclampsia Active Problems:   [redacted] weeks gestation of pregnancy   Vacuum-assisted vaginal delivery  Additional problems: Iron deficiency anemia    Discharge diagnosis: Term Pregnancy Delivered, Preeclampsia (mild), and Anemia                                              Post partum procedures: magnesium sulfate infusion for 24 hours Augmentation: Pitocin and Cytotec Complications: None  Hospital course: Induction of Labor With Vaginal Delivery   23y.o. yo [id number] admitted to the hospital 09/16/2022 for induction of labor.  Indication for induction:  pre-eclampsia without severe features .  Patient had a labor course significant for fetal heart rate decelerations in the second stage requiring expedited delivery. Operative delivery via vacuum assist performed.  Membrane Rupture Time/Date: 3:40 AM ,09/17/2022   Delivery Method:Vaginal, Spontaneous  Episiotomy: None  Lacerations:  None  Details of delivery can be found in separate delivery note.  Patient had a postpartum course complicated by elevated blood pressures managed with mag sulfate infusion and oral antihypertensives. . Patient is discharged home 09/20/22.  Newborn Data: Birth date:09/17/2022  Birth time:2:02 PM  Gender:Female  Living status:Living  Apgars:8 ,9  Weight:3230 g   Magnesium Sulfate received: Yes: Seizure prophylaxis BMZ received: No Rhophylac:N/A MMR:N/A Transfusion:No  Physical exam  Vitals:   09/19/22 1933 09/20/22 0026 09/20/22 0508 09/20/22 0803  BP: (!) 155/89 (!) 148/84 (!) 141/87 131/65  Pulse: 74 78 80 72  Resp: '18  20 18 20  '$ Temp: 98.7 F (37.1 C) 98.6 F (37 C) 98.4 F (36.9 C) 98.5 F (36.9 C)  TempSrc: Oral Oral Oral Oral  SpO2: 97% 99% 99% 99%  Weight:   96.1 kg   Height:       General: alert, cooperative, and no distress Lochia: appropriate Uterine Fundus: firm Perineum: well-approximated DVT Evaluation: No evidence of DVT seen on physical exam. No cords or calf tenderness. No significant calf/ankle edema. Labs: Lab Results  Component Value Date   WBC 9.7 09/18/2022   HGB 9.9 (L) 09/18/2022   HCT 30.3 (L) 09/18/2022   MCV 82.8 09/18/2022   PLT 197 09/18/2022      Latest Ref Rng & Units 09/18/2022    2:24 AM  CMP  Glucose 70 - 99 mg/dL 90   BUN 6 - 20 mg/dL <5   Creatinine 0.44 - 1.00 mg/dL 0.67   Sodium 135 - 145 mmol/L 134   Potassium 3.5 - 5.1 mmol/L 4.0   Chloride 98 - 111 mmol/L 105   CO2 22 - 32 mmol/L 26   Calcium 8.9 - 10.3 mg/dL 7.8   Total Protein 6.5 - 8.1 g/dL 5.8   Total Bilirubin 0.3 - 1.2 mg/dL 0.4   Alkaline Phos 38 - 126 U/L 146   AST 15 - 41 U/L 23   ALT 0 - 44 U/L 9    Edinburgh Score:    09/18/2022    9:22 PM  Edinburgh Postnatal Depression Scale Screening Tool  I have been  able to laugh and see the funny side of things. 0  I have looked forward with enjoyment to things. 0  I have blamed myself unnecessarily when things went wrong. 1  I have been anxious or worried for no good reason. 2  I have felt scared or panicky for no good reason. 0  Things have been getting on top of me. 1  I have been so unhappy that I have had difficulty sleeping. 0  I have felt sad or miserable. 0  I have been so unhappy that I have been crying. 0  The thought of harming myself has occurred to me. 0  Edinburgh Postnatal Depression Scale Total 4      After visit meds:  Allergies as of 09/20/2022   No Known Allergies      Medication List     STOP taking these medications    benzonatate 100 MG capsule Commonly known as: TESSALON   ondansetron 4 MG  disintegrating tablet Commonly known as: ZOFRAN-ODT   phenol 1.4 % Liqd Commonly known as: CHLORASEPTIC       TAKE these medications    acetaminophen 325 MG tablet Commonly known as: TYLENOL Take 650 mg by mouth every 6 (six) hours as needed. What changed: Another medication with the same name was added. Make sure you understand how and when to take each.   acetaminophen 325 MG tablet Commonly known as: Tylenol Take 2 tablets (650 mg total) by mouth every 4 (four) hours as needed (for pain scale < 4). What changed: You were already taking a medication with the same name, and this prescription was added. Make sure you understand how and when to take each.   albuterol 108 (90 Base) MCG/ACT inhaler Commonly known as: Ventolin HFA Inhale 2 puffs into the lungs every 6 (six) hours as needed for wheezing or shortness of breath.   cetirizine 10 MG tablet Commonly known as: ZyrTEC Allergy Take 1 tablet (10 mg total) by mouth daily.   ibuprofen 600 MG tablet Commonly known as: ADVIL Take 1 tablet (600 mg total) by mouth every 6 (six) hours.   menthol-cetylpyridinium 3 MG lozenge Commonly known as: CEPACOL Take 1 lozenge (3 mg total) by mouth as needed for sore throat.   NIFEdipine 30 MG 24 hr tablet Commonly known as: ADALAT CC Take 1 tablet (30 mg total) by mouth daily. Start taking on: September 21, 2022         Discharge home in stable condition Infant Feeding: Breast Infant Disposition:rooming in Discharge instruction: per After Visit Summary and Postpartum booklet. Activity: Advance as tolerated. Pelvic rest for 6 weeks.  Diet: low salt diet Anticipated Birth Control: Unsure and written materials provided Postpartum Appointment:6 weeks Additional Postpartum F/U: BP check 1 week Future Appointments:No future appointments. Follow up Visit:  [facility]. Schedule an appointment as soon as possible for a visit in 1  week(s).   Specialty: Obstetrics and Gynecology Why: Return to [facility] for a blood presure check in 1 week and then again in 6 weeks for your postpartum visit. Contact information: [address] [place] [phone]                    09/20/2022 Arrie Eastern, CNM

## 2022-09-29 ENCOUNTER — Telehealth (HOSPITAL_COMMUNITY): Payer: Self-pay | Admitting: *Deleted

## 2022-09-29 NOTE — Telephone Encounter (Signed)
Patient voiced no questions or concerns regarding her health at this time. EPDS=1. Patient voiced no questions or concerns regarding infant at this time. Patient reports infant sleeps in a bassinet on his back. RN reviewed ABCs of safe sleep. Patient verbalized understanding. Patient requested RN email information on hospital's virtual postpartum classes and support groups. Email sent. Erline Levine, RN, 09/29/22, [phone]

## 2022-12-05 ENCOUNTER — Other Ambulatory Visit: Payer: Self-pay

## 2022-12-05 ENCOUNTER — Emergency Department (HOSPITAL_COMMUNITY): Payer: Medicaid Other

## 2022-12-05 ENCOUNTER — Emergency Department (HOSPITAL_COMMUNITY)
Admission: EM | Admit: 2022-12-05 | Discharge: 2022-12-05 | Disposition: A | Discharge status: Home / Self Care | Payer: Medicaid Other | Attending: Emergency Medicine | Admitting: Emergency Medicine

## 2022-12-05 DIAGNOSIS — N939 Abnormal uterine and vaginal bleeding, unspecified: Secondary | ICD-10-CM | POA: Diagnosis not present

## 2022-12-05 DIAGNOSIS — R109 Unspecified abdominal pain: Secondary | ICD-10-CM | POA: Diagnosis present

## 2022-12-05 LAB — CBC
HCT: 39.6 % (ref 36.0–46.0)
Hemoglobin: 12.4 g/dL (ref 12.0–15.0)
MCH: 27.7 pg (ref 26.0–34.0)
MCHC: 31.3 g/dL (ref 30.0–36.0)
MCV: 88.4 fL (ref 80.0–100.0)
Platelets: 233 10*3/uL (ref 150–400)
RBC: 4.48 MIL/uL (ref 3.87–5.11)
RDW: 15.1 % (ref 11.5–15.5)
WBC: 4.5 10*3/uL (ref 4.0–10.5)
nRBC: 0 % (ref 0.0–0.2)

## 2022-12-05 LAB — I-STAT BETA HCG BLOOD, ED (MC, WL, AP ONLY): I-stat hCG, quantitative: <5 m[IU]/mL (ref <5)

## 2022-12-05 MED ORDER — ORTHO-NOVUM 7/7/7 (28) 0.5/0.75/1-35 MG-MCG PO TABS: 1.0000 | ORAL_TABLET | Freq: Three times a day (TID) | ORAL | 0 refills | Status: AC

## 2022-12-05 NOTE — ED Triage Notes (Signed)
Pt. Stated, Im bleeding a lot for 4 days , Im using 3-4 pads not to get it all over. Im also having a headache.

## 2022-12-05 NOTE — Discharge Instructions (Signed)
You were seen in the emergency department for vaginal bleeding. Your labs and imaging were negative at this time and I believe that your bleeding would benefit from a short course of estrogen containing contraceptives.  I have sent a prescription for Ortho-Novum which she will take 3 times daily for the next week.  Please call your OB/GYN and ask for evaluation for this abnormal bleeding that you have been experiencing.  If you have any acute worsening or symptoms or begin experience significant weakness, dizziness, lightheadedness, please return the emergency department for further evaluation.

## 2022-12-05 NOTE — ED Provider Notes (Signed)
[place] EMERGENCY DEPARTMENT AT [facility] Provider Note   CSN: [id number] Arrival date & time: 12/05/22  1039     History Chief Complaint  Patient presents with   Vaginal Bleeding   Headache   Abdominal Pain    Sandra Roach is a 22 y.o. female.  Patient presents emergency department complaints of vaginal bleeding, headache, abdominal pain.  Patient reports that she had a vaginal delivery about 2 and half/3 months ago without any complications.  Denies any anticoagulant use and no history of any bleeding disorders.  Reports that she has been having significant bleeding and passing of small clots and reports that she is currently soaking multiple pads per hour.  States that it feels report, patient's legs have typically been consistent without any acute changes.   Vaginal Bleeding Associated symptoms: abdominal pain   Headache Associated symptoms: abdominal pain   Abdominal Pain Associated symptoms: vaginal bleeding        Home Medications Prior to Admission medications   Medication Sig Start Date End Date Taking? Authorizing Provider  acetaminophen (TYLENOL) 325 MG tablet Take 650 mg by mouth every 6 (six) hours as needed.   Yes [provider]  norethindrone-ethinyl estradiol (ORTHO-NOVUM 7/7/7, 28,) 0.5/0.75/1-35 MG-MCG tablet Take 1 tablet by mouth in the morning, at noon, and at bedtime for 7 days. 12/05/22 12/12/22 Yes Smitty Knudsen, PA-C  acetaminophen (TYLENOL) 325 MG tablet Take 2 tablets (650 mg total) by mouth every 4 (four) hours as needed (for pain scale < 4). Patient not taking: Reported on 12/05/2022 09/20/22   Roma Schanz, CNM  albuterol (VENTOLIN HFA) 108 (90 Base) MCG/ACT inhaler Inhale 2 puffs into the lungs every 6 (six) hours as needed for wheezing or shortness of breath. Patient not taking: Reported on 12/05/2022 08/10/22   Hurshel Party, CNM  cetirizine (ZYRTEC ALLERGY) 10 MG tablet Take 1 tablet (10 mg total) by mouth  daily. Patient not taking: Reported on 09/16/2022 07/19/21   Valinda Hoar, NP  ibuprofen (ADVIL) 600 MG tablet Take 1 tablet (600 mg total) by mouth every 6 (six) hours. Patient not taking: Reported on 12/05/2022 09/20/22   Roma Schanz, CNM  menthol-cetylpyridinium (CEPACOL) 3 MG lozenge Take 1 lozenge (3 mg total) by mouth as needed for sore throat. Patient not taking: Reported on 09/16/2022 08/12/22   Elberta Fortis, MD  NIFEdipine (ADALAT CC) 30 MG 24 hr tablet Take 1 tablet (30 mg total) by mouth daily. Patient not taking: Reported on 12/05/2022 09/21/22 10/21/22  Roma Schanz, CNM      Allergies    Patient has no known allergies.    Review of Systems   Review of Systems  Gastrointestinal:  Positive for abdominal pain.  Genitourinary:  Positive for vaginal bleeding.  Neurological:  Positive for headaches.  All other systems reviewed and are negative.   Physical Exam Updated Vital Signs BP (!) 118/55   Pulse (!) 55   Temp 98.6 F (37 C)   Resp 16   Ht 5\' 5"  (1.651 m)   Wt 97.5 kg   LMP 11/24/2022   SpO2 99%   BMI 35.78 kg/m  Physical Exam Vitals and nursing note reviewed. Exam conducted with a chaperone present.  Constitutional:      General: She is not in acute distress.    Appearance: She is well-developed.  HENT:     Head: Normocephalic and atraumatic.  Eyes:     Conjunctiva/sclera: Conjunctivae normal.  Cardiovascular:  Rate and Rhythm: Normal rate and regular rhythm.     Heart sounds: No murmur heard. Pulmonary:     Effort: Pulmonary effort is normal. No respiratory distress.     Breath sounds: Normal breath sounds.  Abdominal:     Palpations: Abdomen is soft.     Tenderness: There is no abdominal tenderness.  Genitourinary:    General: Normal vulva.     Exam position: Supine.     Vagina: Normal.     Cervix: Cervical bleeding present.     Uterus: Not tender.      Comments: Some bleeding in vaginal vault with pooling in speculum noted. Bleeding  appears to be coming from the cervix, but os is closed.  Musculoskeletal:        General: No swelling.     Cervical back: Neck supple.  Skin:    General: Skin is warm and dry.     Capillary Refill: Capillary refill takes less than 2 seconds.  Neurological:     Mental Status: She is alert.  Psychiatric:        Mood and Affect: Mood normal.     ED Results / Procedures / Treatments   Labs (all labs ordered are listed, but only abnormal results are displayed) Labs Reviewed  CBC  COMPREHENSIVE METABOLIC PANEL  I-STAT BETA HCG BLOOD, ED ([facility], [facility], [facility] ONLY)    EKG None  Radiology US PELVIC COMPLETE W TRANSVAGINAL AND TORSION R/O  Result Date: 12/05/2022 CLINICAL DATA:  Vaginal bleeding. EXAM: TRANSABDOMINAL AND TRANSVAGINAL ULTRASOUND OF PELVIS DOPPLER ULTRASOUND OF OVARIES TECHNIQUE: Both transabdominal and transvaginal ultrasound examinations of the pelvis were performed. Transabdominal technique was performed for global imaging of the pelvis including uterus, ovaries, adnexal regions, and pelvic cul-de-sac. It was necessary to proceed with endovaginal exam following the transabdominal exam to visualize the uterus, endometrium, ovaries, and adnexa. Color and duplex Doppler ultrasound was utilized to evaluate blood flow to the ovaries. COMPARISON:  None Available. FINDINGS: Uterus Measurements: 7.7 x 3.8 x 4.1 cm = volume: 63 mL. No fibroids or other mass visualized. Endometrium Thickness: 6 mm.  No focal abnormality visualized. Right ovary Measurements: 2.2 x 2.4 x 1.4 cm = volume: 3.9 mL. Normal appearance/no adnexal mass. Left ovary Measurements: 3.4 x 2.3 x 3.6 cm = volume: 14.4 mL. Normal appearance/no adnexal mass. Pulsed Doppler evaluation of both ovaries demonstrates normal low-resistance arterial and venous waveforms. Other findings No abnormal free fluid. IMPRESSION: 1. Normal pelvic ultrasound. Electronically Signed   By: Obie Dredge M.D.   On: 12/05/2022 15:54     Procedures Procedures   Medications Ordered in ED Medications - No data to display  ED Course/ Medical Decision Making/ A&P                           Medical Decision Making Amount and/or Complexity of Data Reviewed Labs: ordered. Radiology: ordered.  Risk Prescription drug management.   This patient presents to the ED for concern of vaginal bleeding.  Differential diagnosis includes abnormal uterine bleeding, postpartum hemorrhage, vaginitis, UTI   Lab Tests:  I Ordered, and personally interpreted labs.  The pertinent results include: Normal CBC, pending CMP, negative i-STAT beta-hCG   Imaging Studies ordered:  I ordered imaging studies including ultrasound pelvic I independently visualized and interpreted imaging which showed no evidence of any acute pelvic abnormality or ovarian torsion present I agree with the radiologist interpretation   Problem List / ED Course:  Patient presents emerged part complaints of vaginal bleeding.  She reports vaginal bleeding has been somewhat more heavy for the last 4 days.  Reports that she is bleeding through 3-4 pads at a time.  He also reported a mild headache.  Given presentation, lab workup initiated to assess for possible anemia or potential pregnancy.  These results were normal and negative.  Ultrasound of pelvis performed which did not reveal any acute abnormalities on examination.  Given reassuring findings, patient is stable for discharge home.  Will have patient follow-up with OB/GYN in the next few days as able to get appointment.  Advised patient to return to the emergency department if she has any acute worsening of her symptoms or begins experience more profound weakness, dizziness, lightheadedness, pallor.  Patient is agreeable to treatment plan and verbalized understanding all return precautions.  All questions answered prior to patient discharge.  Final Clinical Impression(s) / ED Diagnoses Final diagnoses:  Abnormal  uterine bleeding    Rx / DC Orders ED Discharge Orders          Ordered    norethindrone-ethinyl estradiol (ORTHO-NOVUM 7/7/7, 28,) 0.5/0.75/1-35 MG-MCG tablet  3 times daily        12/05/22 1647              Smitty Knudsen, PA-C 12/05/22 1846    Linwood Dibbles, MD 12/06/22 1730

## 2024-04-05 ENCOUNTER — Other Ambulatory Visit: Payer: Self-pay

## 2024-04-05 ENCOUNTER — Emergency Department (HOSPITAL_COMMUNITY)
Admission: EM | Admit: 2024-04-05 | Discharge: 2024-04-05 | Discharge status: Against Medical Advice | Payer: Self-pay | Attending: Emergency Medicine | Admitting: Emergency Medicine

## 2024-04-05 ENCOUNTER — Encounter (HOSPITAL_COMMUNITY): Payer: Self-pay | Admitting: Emergency Medicine

## 2024-04-05 DIAGNOSIS — Z5321 Procedure and treatment not carried out due to patient leaving prior to being seen by health care provider: Secondary | ICD-10-CM | POA: Insufficient documentation

## 2024-04-05 DIAGNOSIS — N939 Abnormal uterine and vaginal bleeding, unspecified: Secondary | ICD-10-CM | POA: Insufficient documentation

## 2024-04-05 LAB — COMPREHENSIVE METABOLIC PANEL WITH GFR
ALT: 14 U/L (ref 0–44)
AST: 18 U/L (ref 15–41)
Albumin: 3.8 g/dL (ref 3.5–5.0)
Alkaline Phosphatase: 52 U/L (ref 38–126)
Anion gap: 9 (ref 5–15)
BUN: 6 mg/dL (ref 6–20)
CO2: 23 mmol/L (ref 22–32)
Calcium: 8.8 mg/dL — ABNORMAL LOW (ref 8.9–10.3)
Chloride: 107 mmol/L (ref 98–111)
Creatinine, Ser: 0.69 mg/dL (ref 0.44–1.00)
GFR, Estimated: >60 mL/min (ref >60)
Glucose, Bld: 82 mg/dL (ref 70–99)
Potassium: 3.6 mmol/L (ref 3.5–5.1)
Sodium: 139 mmol/L (ref 135–145)
Total Bilirubin: 0.7 mg/dL (ref 0.0–1.2)
Total Protein: 7.1 g/dL (ref 6.5–8.1)

## 2024-04-05 LAB — URINALYSIS, MICROSCOPIC (REFLEX): RBC / HPF: >50 RBC/hpf (ref 0–5)

## 2024-04-05 LAB — CBC WITH DIFFERENTIAL/PLATELET
Abs Immature Granulocytes: 0.02 K/uL (ref 0.00–0.07)
Basophils Absolute: 0 K/uL (ref 0.0–0.1)
Basophils Relative: 1 %
Eosinophils Absolute: 0.2 K/uL (ref 0.0–0.5)
Eosinophils Relative: 3 %
HCT: 42.2 % (ref 36.0–46.0)
Hemoglobin: 13.8 g/dL (ref 12.0–15.0)
Immature Granulocytes: 0 %
Lymphocytes Relative: 39 %
Lymphs Abs: 2.1 K/uL (ref 0.7–4.0)
MCH: 30.8 pg (ref 26.0–34.0)
MCHC: 32.7 g/dL (ref 30.0–36.0)
MCV: 94.2 fL (ref 80.0–100.0)
Monocytes Absolute: 0.4 K/uL (ref 0.1–1.0)
Monocytes Relative: 7 %
Neutro Abs: 2.8 K/uL (ref 1.7–7.7)
Neutrophils Relative %: 50 %
Platelets: 254 K/uL (ref 150–400)
RBC: 4.48 MIL/uL (ref 3.87–5.11)
RDW: 12.7 % (ref 11.5–15.5)
WBC: 5.4 K/uL (ref 4.0–10.5)
nRBC: 0 % (ref 0.0–0.2)

## 2024-04-05 LAB — URINALYSIS, ROUTINE W REFLEX MICROSCOPIC: Protein, ur: >=300 mg/dL — AB

## 2024-04-05 LAB — HCG, SERUM, QUALITATIVE: Preg, Serum: NEGATIVE

## 2024-04-05 NOTE — ED Triage Notes (Signed)
 Patient arrives ambulatory by POV c/o heavy vaginal bleeding onset of yesterday. Patient states she has been having two periods a month for the past 3-4 months. C/o lower abdominal cramping and lower back aches.

## 2024-04-05 NOTE — ED Provider Triage Note (Signed)
 Emergency Medicine Provider Triage Evaluation Note  Sandra Roach , a 23 y.o. female  was evaluated in triage.  Pt complains of lower abdominal pain, cramping, vaginal bleeding.  She began having a pink discharge yesterday and then progressed today.  She reports last menstrual period in August.  She states she has been having irregular periods, 2/month recently.  No fevers, nausea or vomiting.  Review of Systems  Positive: Vaginal bleeding, lower abdominal pain and cramping Negative: Fever  Physical Exam  BP (!) 137/96 (BP Location: Right Arm)   Pulse 73   Temp 99.7 F (37.6 C)   Resp 20   Ht 5' 5 (1.651 m)   Wt 97.5 kg   LMP 04/04/2024   SpO2 100%   BMI 35.78 kg/m  Gen:   Awake, appears somewhat uncomfortable Resp:  Normal effort  MSK:   Moves extremities without difficulty  Other:    Medical Decision Making  Medically screening exam initiated at 7:17 PM.  Appropriate orders placed.  Sandra Roach was informed that the remainder of the evaluation will be completed by another provider, this initial triage assessment does not replace that evaluation, and the importance of remaining in the ED until their evaluation is complete.  Will need to determine pregnancy status.  Vital signs are stable at this time.   Sandra Chew, PA-C 04/05/24 1918

## 2024-04-05 NOTE — ED Notes (Signed)
 Pt handed registration her stickers, stated she needs to go home. Seen leaving lobby
